# Patient Record
Sex: Female | Born: 1970 | Race: White | Hispanic: No | Marital: Married | Smoking: Current some day smoker
Health system: Southern US, Community
[De-identification: ages and names within clinical notes are randomized; demographics above are authoritative.]

## PROBLEM LIST (undated history)

## (undated) DIAGNOSIS — Z789 Other specified health status: Secondary | ICD-10-CM

## (undated) HISTORY — DX: Other specified health status: Z78.9

## (undated) HISTORY — PX: NO PAST SURGERIES: SHX2092

---

## 2005-02-20 ENCOUNTER — Emergency Department: Payer: Self-pay | Admitting: Emergency Medicine

## 2006-06-03 ENCOUNTER — Ambulatory Visit: Payer: Self-pay | Admitting: Obstetrics and Gynecology

## 2006-06-18 ENCOUNTER — Ambulatory Visit: Payer: Self-pay | Admitting: Obstetrics and Gynecology

## 2006-07-21 ENCOUNTER — Emergency Department: Payer: Self-pay | Admitting: Emergency Medicine

## 2006-12-28 ENCOUNTER — Ambulatory Visit: Payer: Self-pay | Admitting: Obstetrics and Gynecology

## 2008-06-22 ENCOUNTER — Ambulatory Visit: Payer: Self-pay | Admitting: Obstetrics and Gynecology

## 2011-08-13 ENCOUNTER — Ambulatory Visit: Payer: Self-pay | Admitting: Obstetrics and Gynecology

## 2012-08-06 ENCOUNTER — Emergency Department: Payer: Self-pay | Admitting: Emergency Medicine

## 2012-08-17 ENCOUNTER — Ambulatory Visit: Payer: Self-pay | Admitting: Obstetrics and Gynecology

## 2013-08-18 ENCOUNTER — Ambulatory Visit: Payer: Self-pay | Admitting: Obstetrics and Gynecology

## 2014-08-24 ENCOUNTER — Ambulatory Visit: Payer: Self-pay | Admitting: Obstetrics and Gynecology

## 2014-08-25 ENCOUNTER — Ambulatory Visit: Payer: Self-pay | Admitting: Obstetrics and Gynecology

## 2015-06-14 DIAGNOSIS — L237 Allergic contact dermatitis due to plants, except food: Secondary | ICD-10-CM | POA: Insufficient documentation

## 2015-07-24 ENCOUNTER — Other Ambulatory Visit: Payer: Self-pay | Admitting: Obstetrics and Gynecology

## 2015-07-24 DIAGNOSIS — Z1231 Encounter for screening mammogram for malignant neoplasm of breast: Secondary | ICD-10-CM

## 2015-08-27 ENCOUNTER — Ambulatory Visit
Admission: RE | Admit: 2015-08-27 | Discharge: 2015-08-27 | Disposition: A | Discharge status: Home / Self Care | Payer: BLUE CROSS/BLUE SHIELD | Source: Ambulatory Visit | Attending: Obstetrics and Gynecology | Admitting: Obstetrics and Gynecology

## 2015-08-27 DIAGNOSIS — Z1231 Encounter for screening mammogram for malignant neoplasm of breast: Secondary | ICD-10-CM | POA: Insufficient documentation

## 2015-11-12 ENCOUNTER — Emergency Department
Admission: EM | Admit: 2015-11-12 | Discharge: 2015-11-12 | Disposition: A | Discharge status: Home / Self Care | Payer: BLUE CROSS/BLUE SHIELD | Attending: Emergency Medicine | Admitting: Emergency Medicine

## 2015-11-12 ENCOUNTER — Emergency Department: Payer: BLUE CROSS/BLUE SHIELD

## 2015-11-12 ENCOUNTER — Encounter: Payer: Self-pay | Admitting: Medical Oncology

## 2015-11-12 DIAGNOSIS — F17200 Nicotine dependence, unspecified, uncomplicated: Secondary | ICD-10-CM | POA: Diagnosis not present

## 2015-11-12 DIAGNOSIS — Z3202 Encounter for pregnancy test, result negative: Secondary | ICD-10-CM | POA: Diagnosis not present

## 2015-11-12 DIAGNOSIS — N72 Inflammatory disease of cervix uteri: Secondary | ICD-10-CM | POA: Diagnosis not present

## 2015-11-12 DIAGNOSIS — K529 Noninfective gastroenteritis and colitis, unspecified: Secondary | ICD-10-CM | POA: Diagnosis not present

## 2015-11-12 DIAGNOSIS — R1030 Lower abdominal pain, unspecified: Secondary | ICD-10-CM | POA: Diagnosis present

## 2015-11-12 LAB — CBC
HEMATOCRIT: 39.8 % (ref 35.0–47.0)
Hemoglobin: 13.2 g/dL (ref 12.0–16.0)
MCH: 30.1 pg (ref 26.0–34.0)
MCHC: 33.2 g/dL (ref 32.0–36.0)
MCV: 90.5 fL (ref 80.0–100.0)
PLATELETS: 176 10*3/uL (ref 150–440)
RBC: 4.39 MIL/uL (ref 3.80–5.20)
RDW: 13.5 % (ref 11.5–14.5)
WBC: 9.6 10*3/uL (ref 3.6–11.0)

## 2015-11-12 LAB — URINALYSIS COMPLETE WITH MICROSCOPIC (ARMC ONLY)
BACTERIA UA: NONE SEEN
BILIRUBIN URINE: NEGATIVE
GLUCOSE, UA: NEGATIVE mg/dL
Ketones, ur: NEGATIVE mg/dL
LEUKOCYTES UA: NEGATIVE
Nitrite: NEGATIVE
Protein, ur: NEGATIVE mg/dL
Specific Gravity, Urine: 1.002 — ABNORMAL LOW (ref 1.005–1.030)
pH: 6 (ref 5.0–8.0)

## 2015-11-12 LAB — COMPREHENSIVE METABOLIC PANEL
ALBUMIN: 4.2 g/dL (ref 3.5–5.0)
ALT: 32 U/L (ref 14–54)
AST: 28 U/L (ref 15–41)
Alkaline Phosphatase: 64 U/L (ref 38–126)
Anion gap: 6 (ref 5–15)
BILIRUBIN TOTAL: 0.3 mg/dL (ref 0.3–1.2)
BUN: 11 mg/dL (ref 6–20)
CHLORIDE: 108 mmol/L (ref 101–111)
CO2: 24 mmol/L (ref 22–32)
CREATININE: 0.62 mg/dL (ref 0.44–1.00)
Calcium: 9.6 mg/dL (ref 8.9–10.3)
GFR calc Af Amer: >60 mL/min (ref >60)
GLUCOSE: 111 mg/dL — AB (ref 65–99)
POTASSIUM: 4.3 mmol/L (ref 3.5–5.1)
Sodium: 138 mmol/L (ref 135–145)
Total Protein: 7.3 g/dL (ref 6.5–8.1)

## 2015-11-12 LAB — POCT PREGNANCY, URINE: PREG TEST UR: NEGATIVE

## 2015-11-12 LAB — CHLAMYDIA/NGC RT PCR (ARMC ONLY)
CHLAMYDIA TR: NOT DETECTED
N GONORRHOEAE: NOT DETECTED

## 2015-11-12 LAB — WET PREP, GENITAL
CLUE CELLS WET PREP: NONE SEEN
Sperm: NONE SEEN
TRICH WET PREP: NONE SEEN

## 2015-11-12 LAB — LIPASE, BLOOD: LIPASE: 35 U/L (ref 11–51)

## 2015-11-12 MED ORDER — ONDANSETRON HCL 4 MG/2ML IJ SOLN
4.0000 mg | Freq: Once | INTRAMUSCULAR | Status: AC
  Administered 2015-11-12: 4 mg via INTRAVENOUS
  Filled 2015-11-12: qty 2

## 2015-11-12 MED ORDER — FLUCONAZOLE 150 MG PO TABS: 150.0000 mg | ORAL_TABLET | Freq: Once | ORAL | Status: DC

## 2015-11-12 MED ORDER — IOHEXOL 300 MG/ML  SOLN
100.0000 mL | Freq: Once | INTRAMUSCULAR | Status: AC | PRN
  Administered 2015-11-12: 100 mL via INTRAVENOUS

## 2015-11-12 MED ORDER — OXYCODONE-ACETAMINOPHEN 5-325 MG PO TABS: 2.0000 | ORAL_TABLET | Freq: Four times a day (QID) | ORAL | Status: DC | PRN

## 2015-11-12 MED ORDER — IOHEXOL 240 MG/ML SOLN
25.0000 mL | Freq: Once | INTRAMUSCULAR | Status: AC | PRN
  Administered 2015-11-12: 25 mL via ORAL

## 2015-11-12 MED ORDER — MORPHINE SULFATE (PF) 4 MG/ML IV SOLN
4.0000 mg | Freq: Once | INTRAVENOUS | Status: AC
  Administered 2015-11-12: 4 mg via INTRAVENOUS
  Filled 2015-11-12: qty 1

## 2015-11-12 MED ORDER — METRONIDAZOLE 500 MG PO TABS: 500.0000 mg | ORAL_TABLET | Freq: Two times a day (BID) | ORAL | Status: DC

## 2015-11-12 MED ORDER — CIPROFLOXACIN HCL 500 MG PO TABS: 500.0000 mg | ORAL_TABLET | Freq: Two times a day (BID) | ORAL | Status: AC

## 2015-11-12 NOTE — ED Notes (Signed)
Pt reports waking up during the night with lower abd pain. Reports pain is sharp stabbing pain. Reports nausea also.

## 2015-11-12 NOTE — ED Provider Notes (Signed)
[facility] Emergency Department Provider Note     Time seen: ----------------------------------------- 7:45 AM on 11/12/2015 -----------------------------------------    I have reviewed the triage vital signs and the nursing notes.   HISTORY  Chief Complaint Abdominal Pain    HPI Molly [name] is a 44 y.o. female who presents ER for lower abdominal pain. Patient reports pain is sharp stabbing type pain similar to contractions. Patient denies possibility she could be pregnant, also reports some nausea earlier. Pain is 10/10 nothing makes it better, walking or standing makes it worse.   History reviewed. No pertinent past medical history.  There are no active problems to display for this patient.   History reviewed. No pertinent past surgical history.  Allergies Review of patient's allergies indicates no known allergies.  Social History Social History  Substance Use Topics  . Smoking status: Current Every Day Smoker  . Smokeless tobacco: None  . Alcohol Use: Yes     Comment: occ    Review of Systems Constitutional: Negative for fever. Eyes: Negative for visual changes. ENT: Negative for sore throat. Cardiovascular: Negative for chest pain. Respiratory: Negative for shortness of breath. Gastrointestinal: Positive for abdominal pain and nausea Genitourinary: Negative for dysuria. Musculoskeletal: Negative for back pain. Skin: Negative for rash. Neurological: Negative for headaches, focal weakness or numbness.  10-point ROS otherwise negative.  ____________________________________________   PHYSICAL EXAM:  VITAL SIGNS: ED Triage Vitals  Enc Vitals Group     BP 11/12/15 0741 126/81 mmHg     Pulse Rate 11/12/15 0741 76     Resp 11/12/15 0741 18     Temp 11/12/15 0741 98 F (36.7 C)     Temp Source 11/12/15 0741 Oral     SpO2 11/12/15 0741 100 %     Weight 11/12/15 0741 147 lb (66.679 kg)     Height 11/12/15 0741 5\' 5"  (1.651  m)     Head Cir --      Peak Flow --      Pain Score 11/12/15 0741 7     Pain Loc --      Pain Edu? --      Excl. in GC? --     Constitutional: Alert and oriented. Well appearing and in no distress. Eyes: Conjunctivae are normal. PERRL. Normal extraocular movements. ENT   Head: Normocephalic and atraumatic.   Nose: No congestion/rhinnorhea.   Mouth/Throat: Mucous membranes are moist.   Neck: No stridor. Cardiovascular: Normal rate, regular rhythm. Normal and symmetric distal pulses are present in all extremities. No murmurs, rubs, or gallops. Respiratory: Normal respiratory effort without tachypnea nor retractions. Breath sounds are clear and equal bilaterally. No wheezes/rales/rhonchi. Gastrointestinal: Lower abdominal tenderness, no rebound or guarding. Normal bowel sounds. Genitourinary: Mild cervicitis with white vaginal discharge. No adnexal tenderness. Musculoskeletal: Nontender with normal range of motion in all extremities. No joint effusions.  No lower extremity tenderness nor edema. Neurologic:  Normal speech and language. No gross focal neurologic deficits are appreciated. Speech is normal. No gait instability. Skin:  Skin is warm, dry and intact. No rash noted. Psychiatric: Mood and affect are normal. Speech and behavior are normal. Patient exhibits appropriate insight and judgment. ____________________________________________  ED COURSE:  Pertinent labs & imaging results that were available during my care of the patient were reviewed by me and considered in my medical decision making (see chart for details). Patients in no acute distress, will check basic labs and reevaluate. ____________________________________________    LABS (pertinent positives/negatives)  Labs Reviewed  WET PREP, GENITAL - Abnormal; Notable for the following:    Yeast Wet Prep HPF POC FEW (*)    WBC, Wet Prep HPF POC FEW (*)    All other components within normal limits   COMPREHENSIVE METABOLIC PANEL - Abnormal; Notable for the following:    Glucose, Bld 111 (*)    All other components within normal limits  URINALYSIS COMPLETEWITH MICROSCOPIC ([facility] ONLY) - Abnormal; Notable for the following:    Color, Urine COLORLESS (*)    APPearance CLEAR (*)    Specific Gravity, Urine 1.002 (*)    Hgb urine dipstick 1+ (*)    Squamous Epithelial / LPF 0-5 (*)    All other components within normal limits  CHLAMYDIA/NGC RT PCR ([facility] ONLY)  LIPASE, BLOOD  CBC  POC URINE PREG, ED  POCT PREGNANCY, URINE    RADIOLOGY Images were viewed by me  IMPRESSION: 1. Proximal descending colitis, likely infectious or inflammatory in etiology. 2. Hepatomegaly. Liver also appears slightly fatty. 3. Tiny left renal stone, no obstruction.  ____________________________________________  FINAL ASSESSMENT AND PLAN  Abdominal pain, colitis  Plan: Patient with labs and imaging as dictated above. Patient with a surprising finding of colitis on CT given her symptoms. Patient be discharged with Cipro and Flagyl, given Percocet for pain. She is advised to follow-up in 2 days for recheck.   Emily Filbert, MD   Emily Filbert, MD 11/12/15 [phone]

## 2015-11-12 NOTE — Discharge Instructions (Signed)
°  Colitis °Colitis is inflammation of the colon. Colitis may last a short time (acute) or it may last a long time (chronic). °CAUSES °This condition may be caused by: °· Viruses. °· Bacteria. °· Reactions to medicine. °· Certain autoimmune diseases, such as Crohn disease or ulcerative colitis. °SYMPTOMS °Symptoms of this condition include: °· Diarrhea. °· Passing bloody or tarry stool. °· Pain. °· Fever. °· Vomiting. °· Tiredness (fatigue). °· Weight loss. °· Bloating. °· Sudden increase in abdominal pain. °· Having fewer bowel movements than usual. °DIAGNOSIS °This condition is diagnosed with a stool test or a blood test. You may also have other tests, including X-rays, a CT scan, or a colonoscopy. °TREATMENT °Treatment may include: °· Resting the bowel. This involves not eating or drinking for a period of time. °· Fluids that are given through an IV tube. °· Medicine for pain and diarrhea. °· Antibiotic medicines. °· Cortisone medicines. °· Surgery. °HOME CARE INSTRUCTIONS °Eating and Drinking °· Follow instructions from your health care provider about eating or drinking restrictions. °· Drink enough fluid to keep your urine clear or pale yellow. °· Work with a dietitian to determine which foods cause your condition to flare up. °· Avoid foods that cause flare-ups. °· Eat a well-balanced diet. °Medicines °· Take over-the-counter and prescription medicines only as told by your health care provider. °· If you were prescribed an antibiotic medicine, take it as told by your health care provider. Do not stop taking the antibiotic even if you start to feel better. °General Instructions °· Keep all follow-up visits as told by your health care provider. This is important. °SEEK MEDICAL CARE IF: °· Your symptoms do not go away. °· You develop new symptoms. °SEEK IMMEDIATE MEDICAL CARE IF: °· You have a fever that does not go away with treatment. °· You develop chills. °· You have extreme weakness, fainting, or  dehydration. °· You have repeated vomiting. °· You develop severe pain in your abdomen. °· You pass bloody or tarry stool. °  °This information is not intended to replace advice given to you by your health care provider. Make sure you discuss any questions you have with your health care provider. °  °Document Released: 01/08/2005 Document Revised: 08/22/2015 Document Reviewed: 03/26/2015 °Elsevier Interactive Patient Education ©2016 [employer] ° °

## 2015-12-10 ENCOUNTER — Emergency Department
Admission: EM | Admit: 2015-12-10 | Discharge: 2015-12-10 | Discharge status: Against Medical Advice | Payer: BLUE CROSS/BLUE SHIELD | Attending: Emergency Medicine | Admitting: Emergency Medicine

## 2015-12-10 DIAGNOSIS — F17200 Nicotine dependence, unspecified, uncomplicated: Secondary | ICD-10-CM | POA: Insufficient documentation

## 2015-12-10 DIAGNOSIS — Y9289 Other specified places as the place of occurrence of the external cause: Secondary | ICD-10-CM | POA: Insufficient documentation

## 2015-12-10 DIAGNOSIS — Y998 Other external cause status: Secondary | ICD-10-CM | POA: Insufficient documentation

## 2015-12-10 DIAGNOSIS — W228XXA Striking against or struck by other objects, initial encounter: Secondary | ICD-10-CM | POA: Diagnosis not present

## 2015-12-10 DIAGNOSIS — S0181XA Laceration without foreign body of other part of head, initial encounter: Secondary | ICD-10-CM | POA: Diagnosis not present

## 2015-12-10 DIAGNOSIS — Y9389 Activity, other specified: Secondary | ICD-10-CM | POA: Diagnosis not present

## 2015-12-10 NOTE — ED Notes (Addendum)
Pt states she became upset and hit herself in the head with a mason jar.. Pt has a puncture wound to the right side of the head with controlled bleeding.

## 2015-12-12 ENCOUNTER — Telehealth: Payer: Self-pay | Admitting: Emergency Medicine

## 2015-12-12 NOTE — ED Notes (Signed)
Called patient due to lwot to inquire about condition and follow up plans. Left message with my number. 

## 2017-09-18 ENCOUNTER — Other Ambulatory Visit: Payer: Self-pay | Admitting: Obstetrics and Gynecology

## 2017-09-18 DIAGNOSIS — N6489 Other specified disorders of breast: Secondary | ICD-10-CM

## 2017-10-01 ENCOUNTER — Ambulatory Visit
Admission: RE | Admit: 2017-10-01 | Discharge: 2017-10-01 | Disposition: A | Discharge status: Home / Self Care | Payer: 59 | Source: Ambulatory Visit | Attending: Obstetrics and Gynecology | Admitting: Obstetrics and Gynecology

## 2017-10-01 ENCOUNTER — Ambulatory Visit: Payer: BLUE CROSS/BLUE SHIELD

## 2017-10-01 DIAGNOSIS — N6489 Other specified disorders of breast: Secondary | ICD-10-CM

## 2018-06-29 ENCOUNTER — Other Ambulatory Visit: Payer: Self-pay | Admitting: Obstetrics & Gynecology

## 2018-06-29 DIAGNOSIS — Z1231 Encounter for screening mammogram for malignant neoplasm of breast: Secondary | ICD-10-CM

## 2018-08-18 DIAGNOSIS — N841 Polyp of cervix uteri: Secondary | ICD-10-CM | POA: Diagnosis not present

## 2018-08-18 DIAGNOSIS — Z01419 Encounter for gynecological examination (general) (routine) without abnormal findings: Secondary | ICD-10-CM | POA: Diagnosis not present

## 2018-08-18 DIAGNOSIS — R5383 Other fatigue: Secondary | ICD-10-CM | POA: Diagnosis not present

## 2018-09-08 ENCOUNTER — Ambulatory Visit
Admission: RE | Admit: 2018-09-08 | Discharge: 2018-09-08 | Disposition: A | Discharge status: Home / Self Care | Payer: 59 | Source: Ambulatory Visit | Attending: Obstetrics & Gynecology | Admitting: Obstetrics & Gynecology

## 2018-09-08 DIAGNOSIS — Z1231 Encounter for screening mammogram for malignant neoplasm of breast: Secondary | ICD-10-CM

## 2018-09-29 DIAGNOSIS — J019 Acute sinusitis, unspecified: Secondary | ICD-10-CM | POA: Diagnosis not present

## 2019-05-11 ENCOUNTER — Telehealth: Payer: Self-pay | Admitting: Obstetrics & Gynecology

## 2019-05-11 NOTE — Telephone Encounter (Signed)
Left message for pt to call office back to make appt. We have received her medical records from [facility].   Thanks

## 2019-07-29 ENCOUNTER — Ambulatory Visit: Payer: Self-pay | Admitting: Obstetrics and Gynecology

## 2019-08-03 ENCOUNTER — Encounter: Payer: Self-pay | Admitting: Obstetrics and Gynecology

## 2019-08-03 ENCOUNTER — Other Ambulatory Visit: Payer: Self-pay

## 2019-08-03 ENCOUNTER — Other Ambulatory Visit (HOSPITAL_COMMUNITY)
Admission: RE | Admit: 2019-08-03 | Discharge: 2019-08-03 | Disposition: A | Discharge status: Home / Self Care | Payer: BC Managed Care – PPO | Source: Ambulatory Visit | Attending: Obstetrics and Gynecology | Admitting: Obstetrics and Gynecology

## 2019-08-03 ENCOUNTER — Ambulatory Visit (INDEPENDENT_AMBULATORY_CARE_PROVIDER_SITE_OTHER): Payer: BC Managed Care – PPO | Admitting: Obstetrics and Gynecology

## 2019-08-03 VITALS — BP 100/62 | HR 75 | Ht 65.0 in | Wt 140.0 lb

## 2019-08-03 DIAGNOSIS — Z124 Encounter for screening for malignant neoplasm of cervix: Secondary | ICD-10-CM | POA: Insufficient documentation

## 2019-08-03 DIAGNOSIS — Z01419 Encounter for gynecological examination (general) (routine) without abnormal findings: Secondary | ICD-10-CM | POA: Diagnosis not present

## 2019-08-03 DIAGNOSIS — Z0000 Encounter for general adult medical examination without abnormal findings: Secondary | ICD-10-CM

## 2019-08-03 DIAGNOSIS — Z1329 Encounter for screening for other suspected endocrine disorder: Secondary | ICD-10-CM

## 2019-08-03 DIAGNOSIS — Z130 Encounter for screening for diseases of the blood and blood-forming organs and certain disorders involving the immune mechanism: Secondary | ICD-10-CM

## 2019-08-03 DIAGNOSIS — Z1239 Encounter for other screening for malignant neoplasm of breast: Secondary | ICD-10-CM

## 2019-08-03 DIAGNOSIS — Z30011 Encounter for initial prescription of contraceptive pills: Secondary | ICD-10-CM

## 2019-08-03 DIAGNOSIS — Z131 Encounter for screening for diabetes mellitus: Secondary | ICD-10-CM

## 2019-08-03 DIAGNOSIS — Z13220 Encounter for screening for lipoid disorders: Secondary | ICD-10-CM

## 2019-08-03 MED ORDER — CRYSELLE-28 0.3-30 MG-MCG PO TABS: 1.0000 | ORAL_TABLET | Freq: Every day | ORAL | 3 refills | Status: DC

## 2019-08-03 NOTE — Progress Notes (Signed)
Gynecology Annual Exam  PCP: Schermerhorn, Ihor Austinhomas J, MD  Chief Complaint:  Chief Complaint  Patient presents with  . Gynecologic Exam    Anxiety and depression, father rcently dx with cancer    History of Present Illness: Patient is a 48 y.o. [id number] presents for annual exam. The patient has no complaints today.   LMP: Patient's last menstrual period was 06/27/2019 (approximate). Average Interval: regular, 28 days Duration of flow: 5 days Heavy Menses: no Clots: no Intermenstrual Bleeding: no Postcoital Bleeding: no Dysmenorrhea: no   The patient is sexually active. She currently uses OCP (estrogen/progesterone) for contraception. She denies dyspareunia.  The patient does perform self breast exams.  There is no notable family history of breast or ovarian cancer in her family.  The patient wears seatbelts: yes.   The patient has regular exercise: no.    The patient denies current symptoms of depression.    Review of Systems: Review of Systems  Constitutional: Negative for chills, fever, malaise/fatigue and weight loss.  HENT: Negative for congestion, hearing loss and sinus pain.   Eyes: Negative for blurred vision and double vision.  Respiratory: Negative for cough, sputum production, shortness of breath and wheezing.   Cardiovascular: Negative for chest pain, palpitations, orthopnea and leg swelling.  Gastrointestinal: Negative for abdominal pain, constipation, diarrhea, nausea and vomiting.  Genitourinary: Negative for dysuria, flank pain, frequency, hematuria and urgency.  Musculoskeletal: Negative for back pain, falls and joint pain.  Skin: Negative for itching and rash.  Neurological: Negative for dizziness and headaches.  Psychiatric/Behavioral: Negative for depression, substance abuse and suicidal ideas. The patient is not nervous/anxious.     Past Medical History:  History reviewed. No pertinent past medical history.  Past Surgical History:  History  reviewed. No pertinent surgical history.  Gynecologic History:  Patient's last menstrual period was 06/27/2019 (approximate). Contraception: OCP (estrogen/progesterone) Last Pap: Results were:  unknown  Last mammogram: 09/08/2018 Results were: BI-RAD I  Obstetric History: [id number]: G2P2002  Family History:  Family History  Problem Relation Age of Onset  . Breast cancer Paternal Aunt     Social History:  Social History   Socioeconomic History  . Marital status: Married    Spouse name: Not on file  . Number of children: Not on file  . Years of education: Not on file  . Highest education level: Not on file  Occupational History  . Not on file  Social Needs  . Financial resource strain: Not on file  . Food insecurity    Worry: Not on file    Inability: Not on file  . Transportation needs    Medical: Not on file    Non-medical: Not on file  Tobacco Use  . Smoking status: Current Every Day Smoker  . Smokeless tobacco: Never Used  Substance and Sexual Activity  . Alcohol use: Yes    Comment: occ  . Drug use: Never  . Sexual activity: Yes    Birth control/protection: Pill  Lifestyle  . Physical activity    Days per week: Not on file    Minutes per session: Not on file  . Stress: Not on file  Relationships  . Social Musicianconnections    Talks on phone: Not on file    Gets together: Not on file    Attends religious service: Not on file    Active member of club or organization: Not on file    Attends meetings of clubs or organizations: Not on file    Relationship  status: Not on file  . Intimate partner violence    Fear of current or ex partner: Not on file    Emotionally abused: Not on file    Physically abused: Not on file    Forced sexual activity: Not on file  Other Topics Concern  . Not on file  Social History Narrative  . Not on file    Allergies:  No Known Allergies  Medications: Prior to Admission medications   Medication Sig Start Date End Date Taking? Authorizing  Provider  [id number] 0.3-30 MG-MCG tablet Take 1 tablet by mouth daily. 11/02/15  Yes [provider]  albuterol (VENTOLIN HFA) 108 (90 Base) MCG/ACT inhaler Inhale into the lungs. 07/23/16   [provider]    Physical Exam Vitals: Blood pressure 100/62, pulse 75, height 5\' 5"  (1.651 m), weight 140 lb (63.5 kg), last menstrual period 06/27/2019.  General: NAD HEENT: normocephalic, anicteric Thyroid: no enlargement, no palpable nodules Pulmonary: No increased work of breathing, CTAB Cardiovascular: RRR, distal pulses 2+ Breast: Breast symmetrical, no tenderness, no palpable nodules or masses, no skin or nipple retraction present, no nipple discharge.  No axillary or supraclavicular lymphadenopathy. Abdomen: NABS, soft, non-tender, non-distended.  Umbilicus without lesions.  No hepatomegaly, splenomegaly or masses palpable. No evidence of hernia  Genitourinary:  External: Normal external female genitalia.  Normal urethral meatus, normal Bartholin's and Skene's glands.    Vagina: Normal vaginal mucosa, no evidence of prolapse.    Cervix: Grossly normal in appearance, no bleeding  Uterus: Non-enlarged, mobile, normal contour.  No CMT  Adnexa: ovaries non-enlarged, no adnexal masses  Rectal: deferred  Lymphatic: no evidence of inguinal lymphadenopathy Extremities: no edema, erythema, or tenderness Neurologic: Grossly intact Psychiatric: mood appropriate, affect full  Female chaperone present for pelvic and breast  portions of the physical exam    Assessment: 48 y.o. [id number] routine annual exam  Plan: Problem List Items Addressed This Visit    None    Visit Diagnoses    Healthcare maintenance    -  Primary   Screening for cervical cancer       Screening for breast cancer          1) Mammogram - recommend yearly screening mammogram.  Mammogram Was ordered today   2) STI screening  was offered and declined  3) ASCCP guidelines and rational discussed.   Patient opts for every 5 years screening interval  4) Contraception - the patient is currently using  IUD.  She is happy with her current form of contraception and plans to continue  Counseled against continuation of OCP pill because of her age and smoking status. Discussed the risks of heart attack and stroke which are significantly elevated for her. She did not wish to switch contraceptive methods despite my cautioning against their continued usaged because of risk of heart attack and stroke. Reviewed that this was a category 3 recommendation based on [facility] criteria or ver the age of 42 and smoking 2 cigarettes a day.  Declined alternative measures such as POP or IUD or Depo Provera. "Maybe next year" was her response to counseling.    5) Colonoscopy -- Screening recommended starting at age 76 for average risk individuals, age 32 for individuals deemed at increased risk (including African Americans) and recommended to continue until age 53.  For patient age 61-85 individualized approach is recommended.  Gold standard screening is via colonoscopy, Cologuard screening is an acceptable alternative for patient unwilling or unable to undergo colonoscopy.  "Colorectal  cancer screening for average?risk adults: 2018 guideline update from the American Cancer Society"CA: A Cancer Journal for Clinicians: May 13, 2017   6) Routine healthcare maintenance including cholesterol, diabetes screening discussed To return fasting at a later date  7) No follow-ups on file.  Adelene Idlerhristanna Benson Porcaro MD [facility] 08/03/2019 2:52 PM

## 2019-08-08 LAB — CYTOLOGY - PAP
Diagnosis: NEGATIVE
HPV: NOT DETECTED

## 2019-08-08 NOTE — Progress Notes (Signed)
Pt aware.

## 2019-08-08 NOTE — Progress Notes (Signed)
Please call patient with normal pap smear result. Thank you

## 2019-08-12 ENCOUNTER — Other Ambulatory Visit: Payer: BC Managed Care – PPO

## 2019-08-12 ENCOUNTER — Other Ambulatory Visit: Payer: Self-pay

## 2019-08-12 DIAGNOSIS — Z131 Encounter for screening for diabetes mellitus: Secondary | ICD-10-CM | POA: Diagnosis not present

## 2019-08-12 DIAGNOSIS — Z1329 Encounter for screening for other suspected endocrine disorder: Secondary | ICD-10-CM

## 2019-08-12 DIAGNOSIS — Z0000 Encounter for general adult medical examination without abnormal findings: Secondary | ICD-10-CM

## 2019-08-12 DIAGNOSIS — Z130 Encounter for screening for diseases of the blood and blood-forming organs and certain disorders involving the immune mechanism: Secondary | ICD-10-CM | POA: Diagnosis not present

## 2019-08-12 DIAGNOSIS — Z13220 Encounter for screening for lipoid disorders: Secondary | ICD-10-CM

## 2019-08-13 LAB — COMPREHENSIVE METABOLIC PANEL
ALT: 22 IU/L (ref 0–32)
AST: 19 IU/L (ref 0–40)
Albumin/Globulin Ratio: 2 (ref 1.2–2.2)
Albumin: 4.7 g/dL (ref 3.8–4.8)
Alkaline Phosphatase: 52 IU/L (ref 39–117)
BUN/Creatinine Ratio: 15 (ref 9–23)
BUN: 11 mg/dL (ref 6–24)
Bilirubin Total: 0.4 mg/dL (ref 0.0–1.2)
CO2: 21 mmol/L (ref 20–29)
Calcium: 9.2 mg/dL (ref 8.7–10.2)
Chloride: 105 mmol/L (ref 96–106)
Creatinine, Ser: 0.75 mg/dL (ref 0.57–1.00)
GFR calc Af Amer: 109 mL/min/{1.73_m2} (ref >59)
GFR calc non Af Amer: 95 mL/min/{1.73_m2} (ref >59)
Globulin, Total: 2.3 g/dL (ref 1.5–4.5)
Glucose: 100 mg/dL — ABNORMAL HIGH (ref 65–99)
Potassium: 4.2 mmol/L (ref 3.5–5.2)
Sodium: 142 mmol/L (ref 134–144)
Total Protein: 7 g/dL (ref 6.0–8.5)

## 2019-08-13 LAB — LIPID PANEL
Chol/HDL Ratio: 4.5 ratio — ABNORMAL HIGH (ref 0.0–4.4)
Cholesterol, Total: 177 mg/dL (ref 100–199)
HDL: 39 mg/dL — ABNORMAL LOW (ref >39)
LDL Calculated: 121 mg/dL — ABNORMAL HIGH (ref 0–99)
Triglycerides: 85 mg/dL (ref 0–149)
VLDL Cholesterol Cal: 17 mg/dL (ref 5–40)

## 2019-08-13 LAB — TSH+FREE T4
Free T4: 1.39 ng/dL (ref 0.82–1.77)
TSH: 0.779 u[IU]/mL (ref 0.450–4.500)

## 2019-08-13 LAB — CBC
Hematocrit: 38.8 % (ref 34.0–46.6)
Hemoglobin: 13.1 g/dL (ref 11.1–15.9)
MCH: 31.4 pg (ref 26.6–33.0)
MCHC: 33.8 g/dL (ref 31.5–35.7)
MCV: 93 fL (ref 79–97)
Platelets: 177 10*3/uL (ref 150–450)
RBC: 4.17 x10E6/uL (ref 3.77–5.28)
RDW: 12.3 % (ref 11.7–15.4)
WBC: 6.3 10*3/uL (ref 3.4–10.8)

## 2019-08-26 ENCOUNTER — Other Ambulatory Visit: Payer: Self-pay

## 2019-08-26 ENCOUNTER — Telehealth: Payer: Self-pay | Admitting: Obstetrics and Gynecology

## 2019-08-26 DIAGNOSIS — Z30011 Encounter for initial prescription of contraceptive pills: Secondary | ICD-10-CM

## 2019-08-26 MED ORDER — CRYSELLE-28 0.3-30 MG-MCG PO TABS: 1.0000 | ORAL_TABLET | Freq: Every day | ORAL | 11 refills | Status: DC

## 2019-08-26 NOTE — Telephone Encounter (Signed)
Called pt and LVM for her to call the pharmacy and see if they put the medication back because she didn't pick it up in the 7-10 day window. There is a conformation that the pharmacy received the medication request on 08/03/19.

## 2019-08-26 NOTE — Telephone Encounter (Signed)
Patient calling stating Rx was not ever sent to pharmacy for [facility], she was seen by CS on 8/19.  [place].

## 2019-09-21 ENCOUNTER — Other Ambulatory Visit: Payer: Self-pay

## 2019-09-21 ENCOUNTER — Ambulatory Visit
Admission: RE | Admit: 2019-09-21 | Discharge: 2019-09-21 | Disposition: A | Discharge status: Home / Self Care | Payer: 59 | Source: Ambulatory Visit | Attending: Obstetrics and Gynecology | Admitting: Obstetrics and Gynecology

## 2019-09-21 DIAGNOSIS — Z1231 Encounter for screening mammogram for malignant neoplasm of breast: Secondary | ICD-10-CM | POA: Diagnosis not present

## 2019-09-21 DIAGNOSIS — Z1239 Encounter for other screening for malignant neoplasm of breast: Secondary | ICD-10-CM

## 2019-09-21 NOTE — Progress Notes (Signed)
Please call patient with normal result.

## 2019-09-21 NOTE — Progress Notes (Signed)
Pt aware.

## 2019-10-21 ENCOUNTER — Other Ambulatory Visit: Payer: Self-pay

## 2019-10-21 DIAGNOSIS — Z20822 Contact with and (suspected) exposure to covid-19: Secondary | ICD-10-CM

## 2019-10-22 LAB — NOVEL CORONAVIRUS, NAA: SARS-CoV-2, NAA: NOT DETECTED

## 2020-01-03 DIAGNOSIS — R300 Dysuria: Secondary | ICD-10-CM | POA: Diagnosis not present

## 2020-07-10 ENCOUNTER — Other Ambulatory Visit: Payer: Self-pay | Admitting: Obstetrics and Gynecology

## 2020-07-10 DIAGNOSIS — Z1231 Encounter for screening mammogram for malignant neoplasm of breast: Secondary | ICD-10-CM

## 2020-08-07 ENCOUNTER — Encounter: Payer: Self-pay | Admitting: Obstetrics and Gynecology

## 2020-08-07 ENCOUNTER — Other Ambulatory Visit: Payer: Self-pay

## 2020-08-07 ENCOUNTER — Ambulatory Visit (INDEPENDENT_AMBULATORY_CARE_PROVIDER_SITE_OTHER): Payer: BC Managed Care – PPO | Admitting: Obstetrics and Gynecology

## 2020-08-07 VITALS — BP 122/74 | Ht 65.0 in | Wt 148.6 lb

## 2020-08-07 DIAGNOSIS — Z01419 Encounter for gynecological examination (general) (routine) without abnormal findings: Secondary | ICD-10-CM | POA: Diagnosis not present

## 2020-08-07 DIAGNOSIS — Z131 Encounter for screening for diabetes mellitus: Secondary | ICD-10-CM

## 2020-08-07 DIAGNOSIS — Z122 Encounter for screening for malignant neoplasm of respiratory organs: Secondary | ICD-10-CM | POA: Diagnosis not present

## 2020-08-07 DIAGNOSIS — Z1231 Encounter for screening mammogram for malignant neoplasm of breast: Secondary | ICD-10-CM | POA: Diagnosis not present

## 2020-08-07 DIAGNOSIS — Z130 Encounter for screening for diseases of the blood and blood-forming organs and certain disorders involving the immune mechanism: Secondary | ICD-10-CM

## 2020-08-07 DIAGNOSIS — Z87891 Personal history of nicotine dependence: Secondary | ICD-10-CM

## 2020-08-07 DIAGNOSIS — Z0000 Encounter for general adult medical examination without abnormal findings: Secondary | ICD-10-CM

## 2020-08-07 DIAGNOSIS — Z30011 Encounter for initial prescription of contraceptive pills: Secondary | ICD-10-CM

## 2020-08-07 DIAGNOSIS — Z1329 Encounter for screening for other suspected endocrine disorder: Secondary | ICD-10-CM

## 2020-08-07 DIAGNOSIS — Z13220 Encounter for screening for lipoid disorders: Secondary | ICD-10-CM

## 2020-08-07 MED ORDER — CRYSELLE-28 0.3-30 MG-MCG PO TABS: 1.0000 | ORAL_TABLET | Freq: Every day | ORAL | 11 refills | Status: DC

## 2020-08-07 NOTE — Patient Instructions (Signed)
Institute of Medicine Recommended Dietary Allowances for Calcium and Vitamin D  Age (yr) Calcium Recommended Dietary Allowance (mg/day) Vitamin D Recommended Dietary Allowance (international units/day)  9-18 1,300 600  19-50 1,000 600  51-70 1,200 600  71 and older 1,200 800  Data from Institute of Medicine. Dietary reference intakes: calcium, vitamin D. [place], [place]: National Academies Press; 2011.     Exercising to Stay Healthy To become healthy and stay healthy, it is recommended that you do moderate-intensity and vigorous-intensity exercise. You can tell that you are exercising at a moderate intensity if your heart starts beating faster and you start breathing faster but can still hold a conversation. You can tell that you are exercising at a vigorous intensity if you are breathing much harder and faster and cannot hold a conversation while exercising. Exercising regularly is important. It has many health benefits, such as:  Improving overall fitness, flexibility, and endurance.  Increasing bone density.  Helping with weight control.  Decreasing body fat.  Increasing muscle strength.  Reducing stress and tension.  Improving overall health. How often should I exercise? Choose an activity that you enjoy, and set realistic goals. Your health care provider can help you make an activity plan that works for you. Exercise regularly as told by your health care provider. This may include:  Doing strength training two times a week, such as: ? Lifting weights. ? Using resistance bands. ? Push-ups. ? Sit-ups. ? Yoga.  Doing a certain intensity of exercise for a given amount of time. Choose from these options: ? A total of 150 minutes of moderate-intensity exercise every week. ? A total of 75 minutes of vigorous-intensity exercise every week. ? A mix of moderate-intensity and vigorous-intensity exercise every week. Children, pregnant women, people who have not exercised  regularly, people who are overweight, and older adults may need to talk with a health care provider about what activities are safe to do. If you have a medical condition, be sure to talk with your health care provider before you start a new exercise program. What are some exercise ideas? Moderate-intensity exercise ideas include:  Walking 1 mile (1.6 km) in about 15 minutes.  Biking.  Hiking.  Golfing.  Dancing.  Water aerobics. Vigorous-intensity exercise ideas include:  Walking 4.5 miles (7.2 km) or more in about 1 hour.  Jogging or running 5 miles (8 km) in about 1 hour.  Biking 10 miles (16.1 km) or more in about 1 hour.  Lap swimming.  Roller-skating or in-line skating.  Cross-country skiing.  Vigorous competitive sports, such as football, basketball, and soccer.  Jumping rope.  Aerobic dancing. What are some everyday activities that can help me to get exercise?  Yard work, such as: ? Pushing a lawn mower. ? Raking and bagging leaves.  Washing your car.  Pushing a stroller.  Shoveling snow.  Gardening.  Washing windows or floors. How can I be more active in my day-to-day activities?  Use stairs instead of an elevator.  Take a walk during your lunch break.  If you drive, park your car farther away from your work or school.  If you take public transportation, get off one stop early and walk the rest of the way.  Stand up or walk around during all of your indoor phone calls.  Get up, stretch, and walk around every 30 minutes throughout the day.  Enjoy exercise with a friend. Support to continue exercising will help you keep a regular routine of activity. What guidelines can   I follow while exercising?  Before you start a new exercise program, talk with your health care provider.  Do not exercise so much that you hurt yourself, feel dizzy, or get very short of breath.  Wear comfortable clothes and wear shoes with good support.  Drink plenty of  water while you exercise to prevent dehydration or heat stroke.  Work out until your breathing and your heartbeat get faster. Where to find more information  U.S[place] Department of Health and Human Services: www.hhs.gov  Centers for Disease Control and Prevention (CDC): www.cdc.gov Summary  Exercising regularly is important. It will improve your overall fitness, flexibility, and endurance.  Regular exercise also will improve your overall health. It can help you control your weight, reduce stress, and improve your bone density.  Do not exercise so much that you hurt yourself, feel dizzy, or get very short of breath.  Before you start a new exercise program, talk with your health care provider. This information is not intended to replace advice given to you by your health care provider. Make sure you discuss any questions you have with your health care provider. Document Revised: 11/13/2017 Document Reviewed: 10/22/2017 Elsevier Patient Education  2020 Elsevier Inc.   Budget-Friendly Healthy Eating There are many ways to save money at the grocery store and continue to eat healthy. You can be successful if you:  Plan meals according to your budget.  Make a grocery list and only purchase food according to your grocery list.  Prepare food yourself. What are tips for following this plan?  Reading food labels  Compare food labels between brand name foods and the store brand. Often the nutritional value is the same, but the store brand is lower cost.  Look for products that do not have added sugar, fat, or salt (sodium). These often cost the same but are healthier for you. Products may be labeled as: ? Sugar-free. ? Nonfat. ? Low-fat. ? Sodium-free. ? Low-sodium.  Look for lean ground beef labeled as at least 92% lean and 8% fat. Shopping  Buy only the items on your grocery list and go only to the areas of the store that have the items on your list.  Use coupons only for foods  and brands you normally buy. Avoid buying items you wouldn't normally buy simply because they are on sale.  Check online and in newspapers for weekly deals.  Buy healthy items from the bulk bins when available, such as herbs, spices, flour, pasta, nuts, and dried fruit.  Buy fruits and vegetables that are in season. Prices are usually lower on in-season produce.  Look at the unit price on the price tag. Use it to compare different brands and sizes to find out which item is the best deal.  Choose healthy items that are often low-cost, such as carrots, potatoes, apples, bananas, and oranges. Dried or canned beans are a low-cost protein source.  Buy in bulk and freeze extra food. Items you can buy in bulk include meats, fish, poultry, frozen fruits, and frozen vegetables.  Avoid buying "ready-to-eat" foods, such as pre-cut fruits and vegetables and pre-made salads.  If possible, shop around to discover where you can find the best prices. Consider other retailers such as dollar stores, larger wholesale stores, local fruit and vegetable stands, and farmers markets.  Do not shop when you are hungry. If you shop while hungry, it may be hard to stick to your list and budget.  Resist impulse buying. Use your grocery list as   your official plan for the week.  Buy a variety of vegetables and fruits by purchasing fresh, frozen, and canned items.  Look at the top and bottom shelves for deals. Foods at eye level (eye level of an adult or child) are usually more expensive.  Be efficient with your time when shopping. The more time you spend at the store, the more money you are likely to spend.  To save money when choosing more expensive foods like meats and dairy: ? Choose cheaper cuts of meat, such as bone-in chicken thighs and drumsticks instead of skinless and boneless chicken. When you are ready to prepare the chicken, you can remove the skin yourself to make it healthier. ? Choose lean meats like  chicken or turkey instead of beef. ? Choose canned seafood, such as tuna, salmon, or sardines. ? Buy eggs as a low-cost source of protein. ? Buy dried beans and peas, such as lentils, split peas, or kidney beans instead of meats. Dried beans and peas are a good alternative source of protein. ? Buy the larger tubs of yogurt instead of individual-sized containers.  Choose water instead of sodas and other sweetened beverages.  Avoid buying chips, cookies, and other "junk food." These items are usually expensive and not healthy. Cooking  Make extra food and freeze the extras in meal-sized containers or in individual portions for fast meals and snacks.  Pre-cook on days when you have extra time to prepare meals in advance. You can keep these meals in the fridge or freezer and reheat for a quick meal.  When you come home from the grocery store, wash, peel, and cut fruits and vegetables so they are ready to use and eat. This will help reduce food waste. Meal planning  Do not eat out or get fast food. Prepare food at home.  Make a grocery list and make sure to bring it with you to the store. If you have a smart phone, you could use your phone to create your shopping list.  Plan meals and snacks according to a grocery list and budget you create.  Use leftovers in your meal plan for the week.  Look for recipes where you can cook once and make enough food for two meals.  Include budget-friendly meals like stews, casseroles, and stir-fry dishes.  Try some meatless meals or try "no cook" meals like salads.  Make sure that half your plate is filled with fruits or vegetables. Choose from fresh, frozen, or canned fruits and vegetables. If eating canned, remember to rinse them before eating. This will remove any excess salt added for packaging. Summary  Eating healthy on a budget is possible if you plan your meals according to your budget, purchase according to your budget and grocery list, and  prepare food yourself.  Tips for buying more food on a limited budget include buying generic brands, using coupons only for foods you normally buy, and buying healthy items from the bulk bins when available.  Tips for buying cheaper food to replace expensive food include choosing cheaper, lean cuts of meat, and buying dried beans and peas. This information is not intended to replace advice given to you by your health care provider. Make sure you discuss any questions you have with your health care provider. Document Revised: 12/02/2017 Document Reviewed: 12/02/2017 Elsevier Patient Education  2020 Elsevier Inc.   Bone Health Bones protect organs, store calcium, anchor muscles, and support the whole body. Keeping your bones strong is important, especially as you   get older. You can take actions to help keep your bones strong and healthy. Why is keeping my bones healthy important?  Keeping your bones healthy is important because your body constantly replaces bone cells. Cells get old, and new cells take their place. As we age, we lose bone cells because the body may not be able to make enough new cells to replace the old cells. The amount of bone cells and bone tissue you have is referred to as bone mass. The higher your bone mass, the stronger your bones. The aging process leads to an overall loss of bone mass in the body, which can increase the likelihood of:  Joint pain and stiffness.  Broken bones.  A condition in which the bones become weak and brittle (osteoporosis). A large decline in bone mass occurs in older adults. In women, it occurs about the time of menopause. What actions can I take to keep my bones healthy? Good health habits are important for maintaining healthy bones. This includes eating nutritious foods and exercising regularly. To have healthy bones, you need to get enough of the right minerals and vitamins. Most nutrition experts recommend getting these nutrients from the  foods that you eat. In some cases, taking supplements may also be recommended. Doing certain types of exercise is also important for bone health. What are the nutritional recommendations for healthy bones?  Eating a well-balanced diet with plenty of calcium and vitamin D will help to protect your bones. Nutritional recommendations vary from person to person. Ask your health care provider what is healthy for you. Here are some general guidelines. Get enough calcium Calcium is the most important (essential) mineral for bone health. Most people can get enough calcium from their diet, but supplements may be recommended for people who are at risk for osteoporosis. Good sources of calcium include:  Dairy products, such as low-fat or nonfat milk, cheese, and yogurt.  Dark green leafy vegetables, such as bok choy and broccoli.  Calcium-fortified foods, such as orange juice, cereal, bread, soy beverages, and tofu products.  Nuts, such as almonds. Follow these recommended amounts for daily calcium intake:  Children, age 1-3: 700 mg.  Children, age 4-8: 1,000 mg.  Children, age 9-13: 1,300 mg.  Teens, age 14-18: 1,300 mg.  Adults, age 19-50: 1,000 mg.  Adults, age 51-70: ? Men: 1,000 mg. ? Women: 1,200 mg.  Adults, age 71 or older: 1,200 mg.  Pregnant and breastfeeding females: ? Teens: 1,300 mg. ? Adults: 1,000 mg. Get enough vitamin D Vitamin D is the most essential vitamin for bone health. It helps the body absorb calcium. Sunlight stimulates the skin to make vitamin D, so be sure to get enough sunlight. If you live in a cold climate or you do not get outside often, your health care provider may recommend that you take vitamin D supplements. Good sources of vitamin D in your diet include:  Egg yolks.  Saltwater fish.  Milk and cereal fortified with vitamin D. Follow these recommended amounts for daily vitamin D intake:  Children and teens, age 1-18: 600 international  units.  Adults, age 50 or younger: 400-800 international units.  Adults, age 51 or older: 800-1,000 international units. Get other important nutrients Other nutrients that are important for bone health include:  Phosphorus. This mineral is found in meat, poultry, dairy foods, nuts, and legumes. The recommended daily intake for adult men and adult women is 700 mg.  Magnesium. This mineral is found in seeds, nuts, dark   green vegetables, and legumes. The recommended daily intake for adult men is 400-420 mg. For adult women, it is 310-320 mg.  Vitamin K. This vitamin is found in green leafy vegetables. The recommended daily intake is 120 mg for adult men and 90 mg for adult women. What type of physical activity is best for building and maintaining healthy bones? Weight-bearing and strength-building activities are important for building and maintaining healthy bones. Weight-bearing activities cause muscles and bones to work against gravity. Strength-building activities increase the strength of the muscles that support bones. Weight-bearing and muscle-building activities include:  Walking and hiking.  Jogging and running.  Dancing.  Gym exercises.  Lifting weights.  Tennis and racquetball.  Climbing stairs.  Aerobics. Adults should get at least 30 minutes of moderate physical activity on most days. Children should get at least 60 minutes of moderate physical activity on most days. Ask your health care provider what type of exercise is best for you. How can I find out if my bone mass is low? Bone mass can be measured with an X-ray test called a bone mineral density (BMD) test. This test is recommended for all women who are age 65 or older. It may also be recommended for:  Men who are age 70 or older.  People who are at risk for osteoporosis because of: ? Having bones that break easily. ? Having a long-term disease that weakens bones, such as kidney disease or rheumatoid  arthritis. ? Having menopause earlier than normal. ? Taking medicine that weakens bones, such as steroids, thyroid hormones, or hormone treatment for breast cancer or prostate cancer. ? Smoking. ? Drinking three or more alcoholic drinks a day. If you find that you have a low bone mass, you may be able to prevent osteoporosis or further bone loss by changing your diet and lifestyle. Where can I find more information? For more information, check out the following websites:  National Osteoporosis Foundation: www.nof.org/patients  National Institutes of Health: www.bones.nih.gov  International Osteoporosis Foundation: www.iofbonehealth.org Summary  The aging process leads to an overall loss of bone mass in the body, which can increase the likelihood of broken bones and osteoporosis.  Eating a well-balanced diet with plenty of calcium and vitamin D will help to protect your bones.  Weight-bearing and strength-building activities are also important for building and maintaining strong bones.  Bone mass can be measured with an X-ray test called a bone mineral density (BMD) test. This information is not intended to replace advice given to you by your health care provider. Make sure you discuss any questions you have with your health care provider. Document Revised: 12/28/2017 Document Reviewed: 12/28/2017 Elsevier Patient Education  2020 Elsevier Inc.   

## 2020-08-07 NOTE — Progress Notes (Signed)
[facility]   Gynecology Annual Exam  PCP: Schermerhorn, Ihor Austin, MD  Chief Complaint:  Chief Complaint  Patient presents with  . Gynecologic Exam    annual exam    History of Present Illness: Patient is a 49 y.o. G2P2002 presents for annual exam. The patient has no complaints today.   LMP: Patient's last menstrual period was 06/17/2020. Average Interval: regular, 28 days Duration of flow: 5 days Heavy Menses: no Clots: no Intermenstrual Bleeding: no Postcoital Bleeding: no Dysmenorrhea: no   The patient is sexually active. She currently uses OCP (estrogen/progesterone) for contraception. She denies dyspareunia.  The patient does perform self breast exams.  There is no notable family history of breast or ovarian cancer in her family.  The patient denies current symptoms of depression.    Review of Systems: ROS  Past Medical History:  There are no problems to display for this patient.   Past Surgical History:  No past surgical history on file.  Gynecologic History:  Patient's last menstrual period was 06/17/2020. Contraception: OCP (estrogen/progesterone) Last Pap: Results were: 2020 NIL  Last mammogram: 2020 Results were: BI-RAD I  Obstetric History: [id number]  Family History:  Family History  Problem Relation Age of Onset  . Breast cancer Paternal Aunt     Social History:  Social History   Socioeconomic History  . Marital status: Married    Spouse name: Not on file  . Number of children: Not on file  . Years of education: Not on file  . Highest education level: Not on file  Occupational History  . Not on file  Tobacco Use  . Smoking status: Current Every Day Smoker  . Smokeless tobacco: Never Used  Vaping Use  . Vaping Use: Never used  Substance and Sexual Activity  . Alcohol use: Yes    Comment: occ  . Drug use: Never  . Sexual activity: Yes    Birth control/protection: Pill  Other Topics Concern  . Not on file  Social History Narrative  . Not on  file   Social Determinants of Health   Financial Resource Strain:   . Difficulty of Paying Living Expenses: Not on file  Food Insecurity:   . Worried About Programme researcher, broadcasting/film/video in the Last Year: Not on file  . Ran Out of Food in the Last Year: Not on file  Transportation Needs:   . Lack of Transportation (Medical): Not on file  . Lack of Transportation (Non-Medical): Not on file  Physical Activity:   . Days of Exercise per Week: Not on file  . Minutes of Exercise per Session: Not on file  Stress:   . Feeling of Stress : Not on file  Social Connections:   . Frequency of Communication with Friends and Family: Not on file  . Frequency of Social Gatherings with Friends and Family: Not on file  . Attends Religious Services: Not on file  . Active Member of Clubs or Organizations: Not on file  . Attends Banker Meetings: Not on file  . Marital Status: Not on file  Intimate Partner Violence:   . Fear of Current or Ex-Partner: Not on file  . Emotionally Abused: Not on file  . Physically Abused: Not on file  . Sexually Abused: Not on file    Allergies:  No Known Allergies  Medications: Prior to Admission medications   Medication Sig Start Date End Date Taking? Authorizing Provider  albuterol (VENTOLIN HFA) 108 (90 Base) MCG/ACT inhaler Inhale into the  lungs. 07/23/16   [provider]  CRYSELLE-28 0.3-30 MG-MCG tablet Take 1 tablet by mouth daily. 08/26/19   Natale Milch, MD    Physical Exam Vitals: Blood pressure 122/74, height 5\' 5"  (1.651 m), weight 148 lb 9.6 oz (67.4 kg), last menstrual period 06/17/2020.  General: NAD HEENT: normocephalic, anicteric Thyroid: no enlargement, no palpable nodules Pulmonary: No increased work of breathing, CTAB Cardiovascular: RRR, distal pulses 2+ Breast: Breast symmetrical, no tenderness, no palpable nodules or masses, no skin or nipple retraction present, no nipple discharge.  No axillary or supraclavicular  lymphadenopathy. Abdomen: NABS, soft, non-tender, non-distended.  Umbilicus without lesions.  No hepatomegaly, splenomegaly or masses palpable. No evidence of hernia  Genitourinary:  External: Normal external female genitalia.  Normal urethral meatus, normal Bartholin's and Skene's glands.    Vagina: Normal vaginal mucosa, no evidence of prolapse.    Cervix: Grossly normal in appearance, no bleeding  Uterus: Non-enlarged, mobile, normal contour.  No CMT  Adnexa: ovaries non-enlarged, no adnexal masses  Rectal: deferred  Lymphatic: no evidence of inguinal lymphadenopathy Extremities: no edema, erythema, or tenderness Neurologic: Grossly intact Psychiatric: mood appropriate, affect full  Female chaperone present for pelvic and breast  portions of the physical exam    Assessment: 49 y.o. G2P2002 routine annual exam  Plan: Problem List Items Addressed This Visit    None    Visit Diagnoses    Encounter for screening for lung cancer    -  Primary   Healthcare maintenance       Relevant Orders   CBC With Differential   Comprehensive metabolic panel   Lipid panel   TSH   T4, free   Vitamin D 25 hydroxy (Completed)   Encounter for screening mammogram for malignant neoplasm of breast       Encounter for gynecological examination without abnormal finding       Encounter for annual routine gynecological examination       Health maintenance examination       Encounter for initial prescription of contraceptive pills       Relevant Medications   CRYSELLE-28 0.3-30 MG-MCG tablet   History of tobacco use       Relevant Orders   DG Chest 2 View   Diabetes mellitus screening       Relevant Orders   Comprehensive metabolic panel   Screening for deficiency anemia       Relevant Orders   CBC With Differential   Screening for thyroid disorder       Relevant Orders   TSH   T4, free   Screening cholesterol level       Relevant Orders   Lipid panel      1) Mammogram - recommend  yearly screening mammogram.  Mammogram Is up to date   2) STI screening  was offered and declined  3) ASCCP guidelines and rational discussed.  Patient opts for every 5 years screening interval  4) Contraception - the patient is currently using  OCP (estrogen/progesterone).  She is happy with her current form of contraception and plans to continue  5) Colonoscopy -- Screening recommended starting at age 18 for average risk individuals, age 32 for individuals deemed at increased risk (including African Americans) and recommended to continue until age 65.  For patient age 39-85 individualized approach is recommended.  Gold standard screening is via colonoscopy, Cologuard screening is an acceptable alternative for patient unwilling or unable to undergo colonoscopy.  "Colorectal cancer screening for average?risk  adults: 2018 guideline update from the American Cancer Society"CA: A Cancer Journal for Clinicians: May 13, 2017   6) Routine healthcare maintenance including cholesterol, diabetes screening discussed Ordered today  7) Referral for lung cancer screening- started smoking 1993, Quit 8 months ago  8)  No follow-ups on file.   Natale Milch [facility] 08/21/2020, 9:25 AM

## 2020-08-10 ENCOUNTER — Other Ambulatory Visit: Payer: Self-pay

## 2020-08-10 ENCOUNTER — Other Ambulatory Visit: Payer: BC Managed Care – PPO

## 2020-08-10 DIAGNOSIS — Z01419 Encounter for gynecological examination (general) (routine) without abnormal findings: Secondary | ICD-10-CM | POA: Diagnosis not present

## 2020-08-10 DIAGNOSIS — Z122 Encounter for screening for malignant neoplasm of respiratory organs: Secondary | ICD-10-CM | POA: Diagnosis not present

## 2020-08-10 DIAGNOSIS — Z1231 Encounter for screening mammogram for malignant neoplasm of breast: Secondary | ICD-10-CM | POA: Diagnosis not present

## 2020-08-10 DIAGNOSIS — Z0000 Encounter for general adult medical examination without abnormal findings: Secondary | ICD-10-CM | POA: Diagnosis not present

## 2020-08-11 LAB — VITAMIN D 25 HYDROXY (VIT D DEFICIENCY, FRACTURES): Vit D, 25-Hydroxy: 31.7 ng/mL (ref 30.0–100.0)

## 2020-08-21 ENCOUNTER — Encounter: Payer: Self-pay | Admitting: Obstetrics and Gynecology

## 2020-09-25 ENCOUNTER — Ambulatory Visit
Admission: RE | Admit: 2020-09-25 | Discharge: 2020-09-25 | Disposition: A | Discharge status: Home / Self Care | Payer: BC Managed Care – PPO | Source: Ambulatory Visit | Attending: Obstetrics and Gynecology | Admitting: Obstetrics and Gynecology

## 2020-09-25 ENCOUNTER — Other Ambulatory Visit: Payer: Self-pay

## 2020-09-25 DIAGNOSIS — Z1231 Encounter for screening mammogram for malignant neoplasm of breast: Secondary | ICD-10-CM

## 2020-10-20 IMAGING — MG MM DIGITAL SCREENING BILAT W/ TOMO W/ CAD
8 series · 9 of 24 positions shown · non-contrast
Comparison: Previous exam(s).

CLINICAL DATA: Screening.

EXAM:
DIGITAL SCREENING BILATERAL MAMMOGRAM WITH TOMO AND CAD

[L MLO synth-2D]
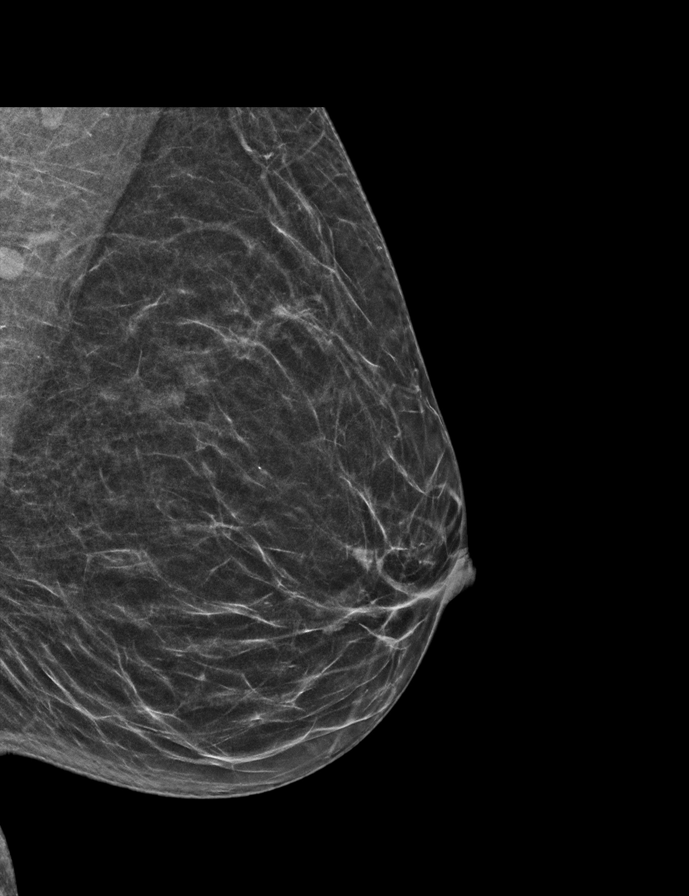

[R CC synth-2D]
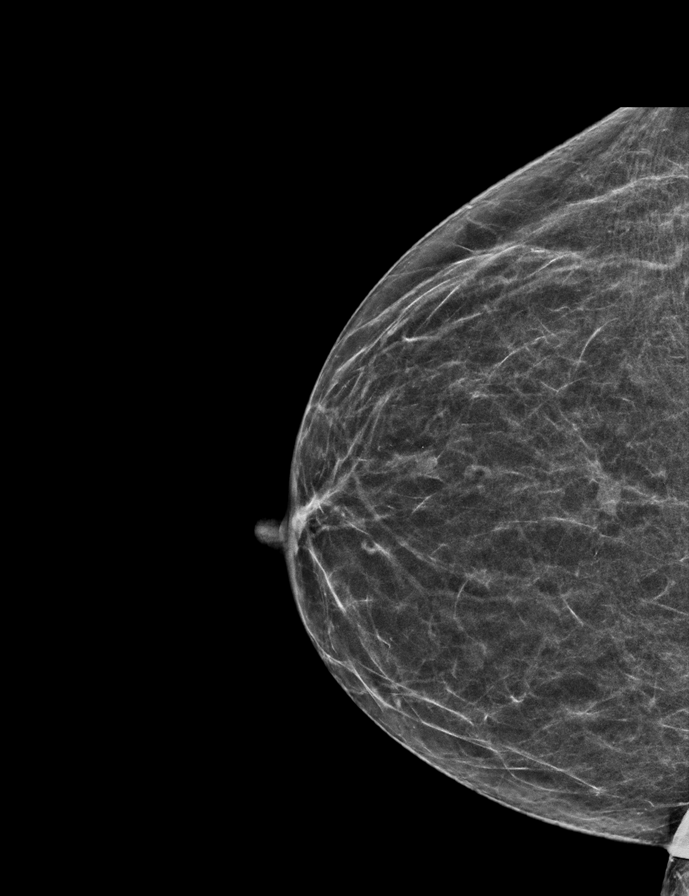

[L CC synth-2D]
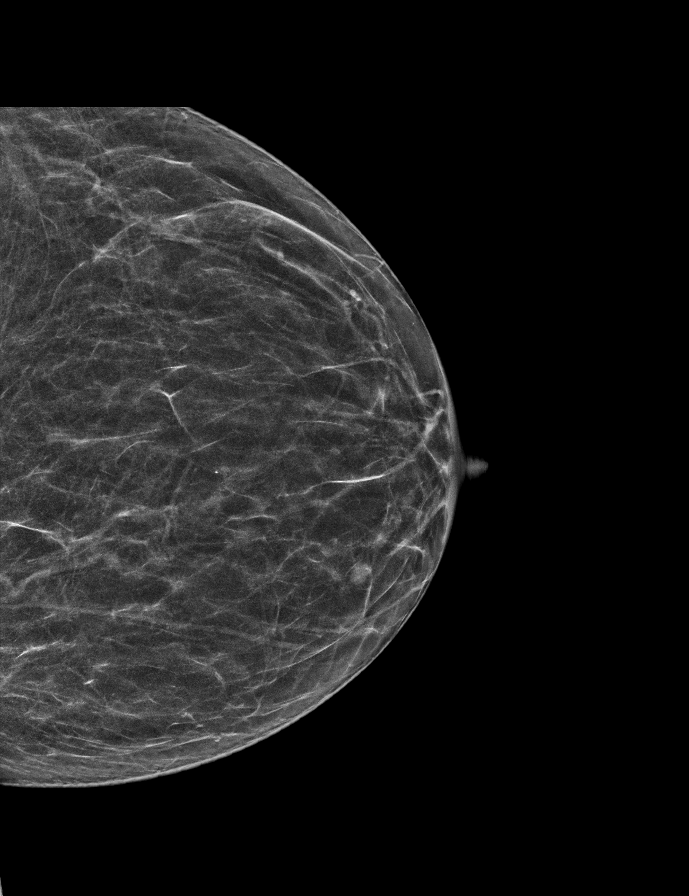

[R MLO synth-2D]
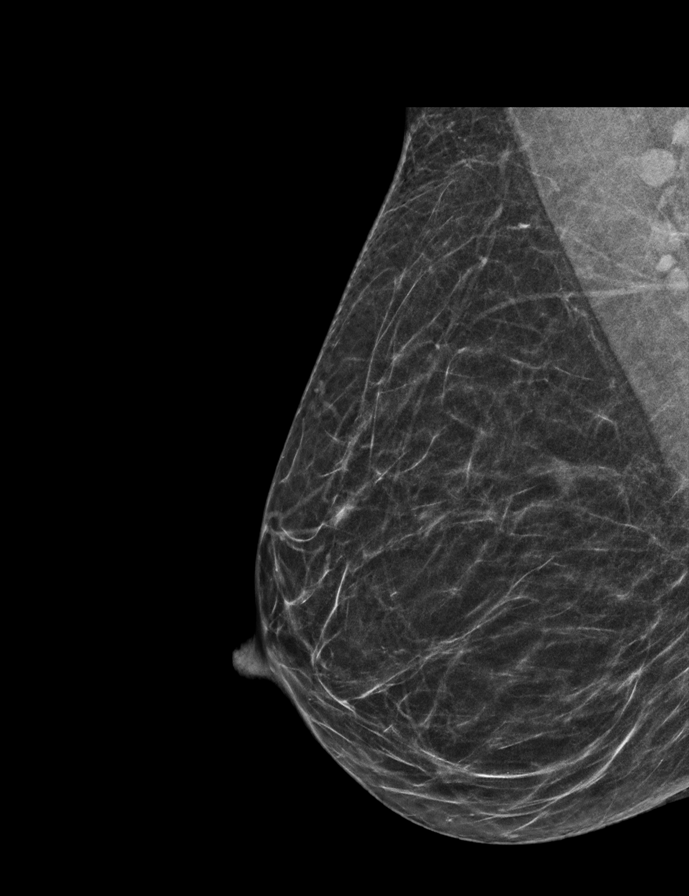

[L CC tomo · 2 of 49 frames shown]
[frame 16/49]
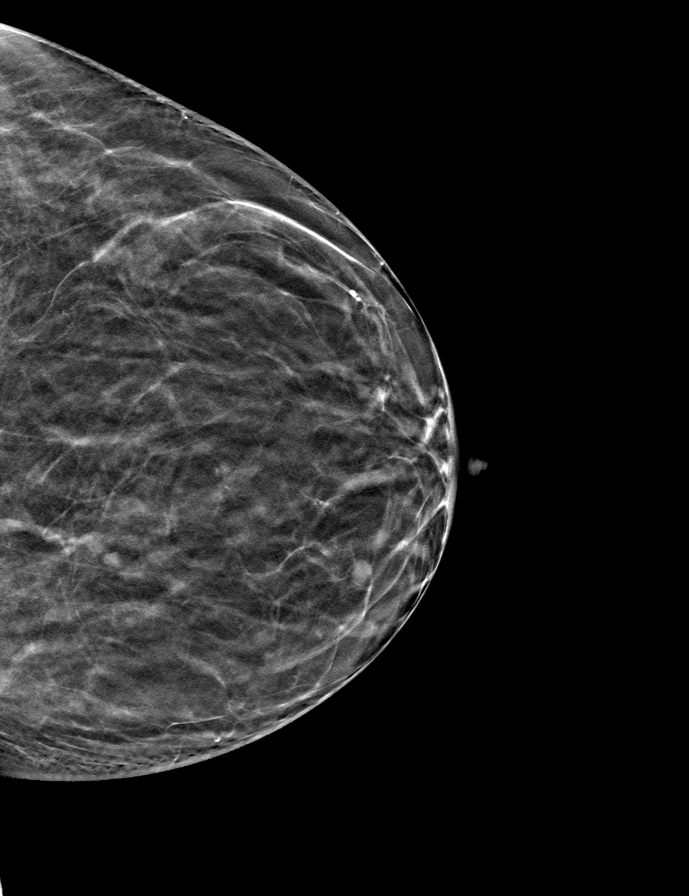
[frame 25/49]
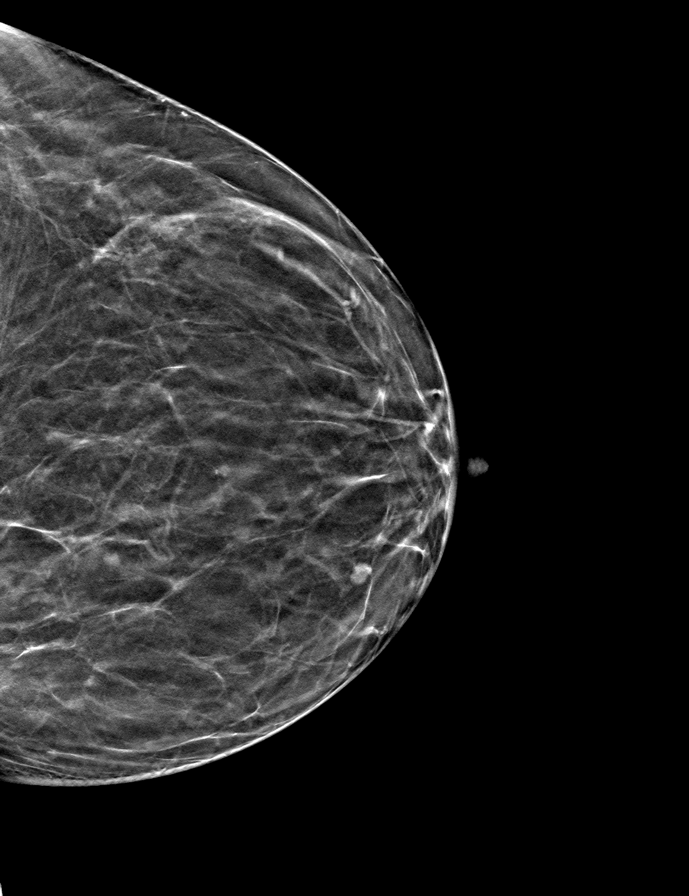

[R MLO tomo · tomo slice 25/49.0]
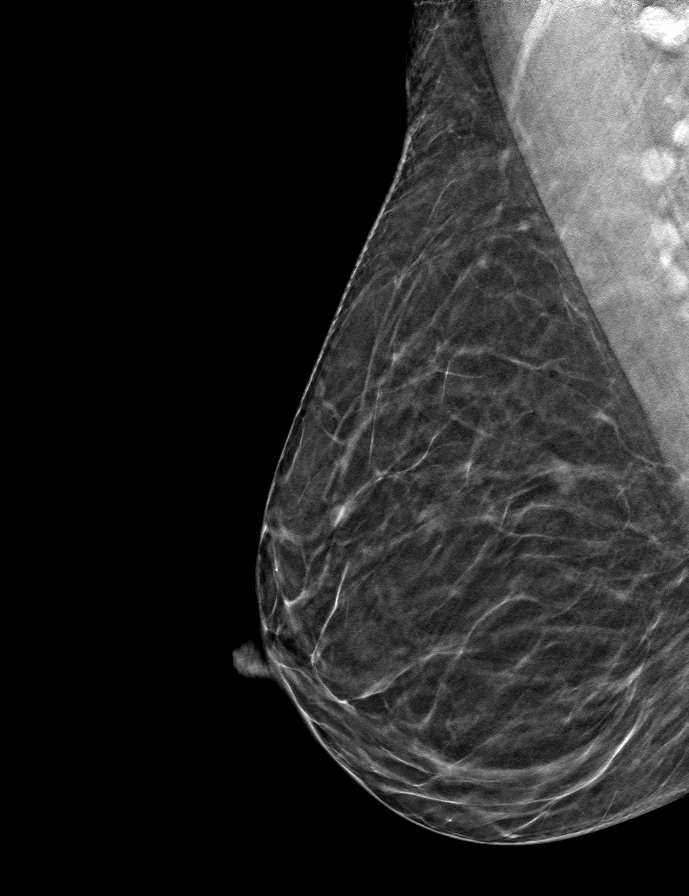

[L MLO tomo · tomo slice 25/48.0]
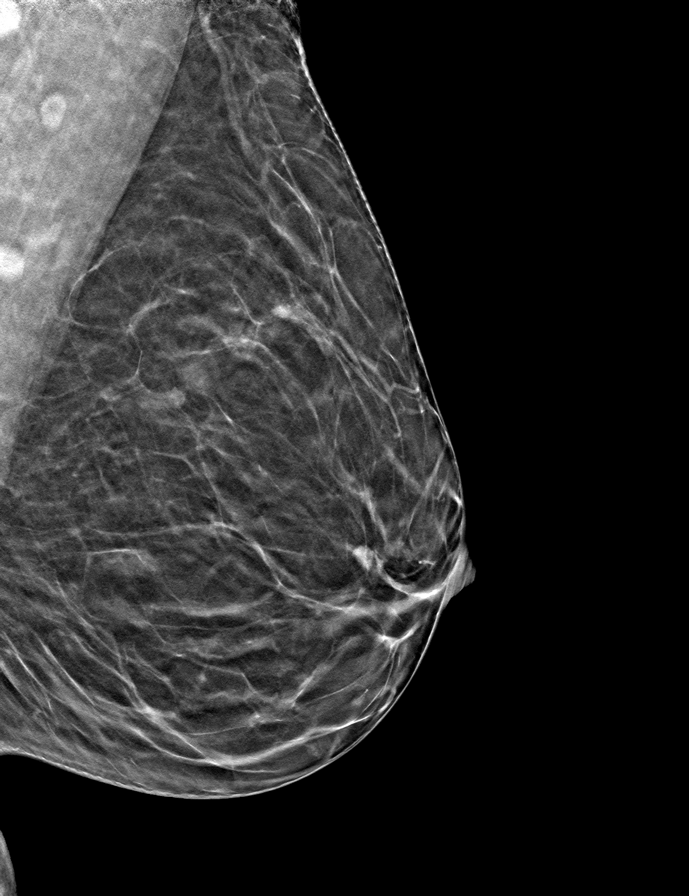

[R CC tomo · tomo slice 24/47.0]
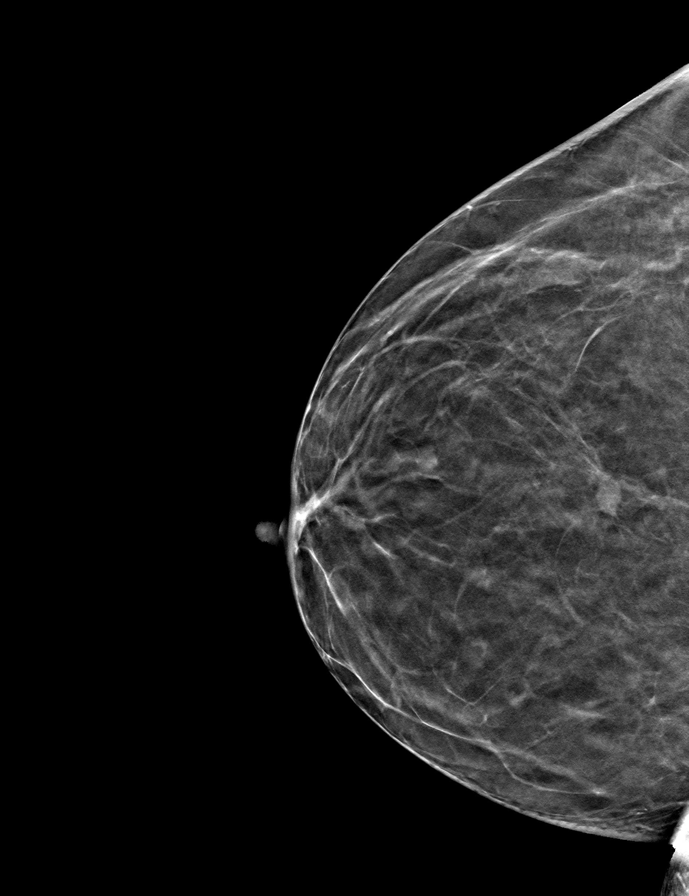

[9 of 24 positions shown; findings below may reference images not displayed]

ACR Breast Density Category b: There are scattered areas of
fibroglandular density.
FINDINGS: There are no findings suspicious for malignancy. Images were
processed with CAD.
IMPRESSION: No mammographic evidence of malignancy. A result letter of this
screening mammogram will be mailed directly to the patient.

RECOMMENDATION:
Screening mammogram in one year. (Code:[id number])

BI-RADS CATEGORY  1: Negative.

## 2020-10-23 ENCOUNTER — Telehealth: Payer: Self-pay

## 2020-10-23 NOTE — Telephone Encounter (Signed)
Pt calling; at her last visit CRS suggested she could be a candidate for a CT Scan d/t her family hx.  Pt has not heard anything from anyone about this.  Pt is not sure her ins will cover a CT Scan.  [phone]  Courtesy call to let pt know CRS is not in office today but will be here tomorrow.  I will send this msg to her.  Pt can be reached at this same number tomorrow.

## 2020-10-25 ENCOUNTER — Other Ambulatory Visit: Payer: Self-pay | Admitting: Obstetrics and Gynecology

## 2020-10-25 DIAGNOSIS — Z87891 Personal history of nicotine dependence: Secondary | ICD-10-CM

## 2020-10-25 NOTE — Telephone Encounter (Signed)
This is for lung cancer screening- I left a message on her voicemail. I will place the referral. Thank you!

## 2020-11-05 ENCOUNTER — Telehealth: Payer: Self-pay

## 2020-11-05 DIAGNOSIS — Z122 Encounter for screening for malignant neoplasm of respiratory organs: Secondary | ICD-10-CM

## 2020-11-05 DIAGNOSIS — Z87891 Personal history of nicotine dependence: Secondary | ICD-10-CM

## 2020-11-05 NOTE — Telephone Encounter (Signed)
Contacted patient for lung CT screening clinic based on referral from [facility].  I left message for patient to call Glenna Fellows, lung navigator to schedule CT scan.

## 2020-11-06 NOTE — Addendum Note (Signed)
Addended by: Jonne Ply on: 11/06/2020 02:48 PM   Modules accepted: Orders

## 2020-11-06 NOTE — Telephone Encounter (Signed)
Received referral for initial lung cancer screening scan. Contacted patient and obtained smoking history,(former, quit 06/15/2019, 22 pack year history) as well as answering questions related to screening process. Patient denies signs of lung cancer such as weight loss or hemoptysis. Patient denies comorbidity that would prevent curative treatment if lung cancer were found. Patient is scheduled for shared decision making visit and CT scan on 11/14/20.

## 2020-11-14 ENCOUNTER — Inpatient Hospital Stay: Payer: BC Managed Care – PPO | Attending: Oncology | Admitting: Oncology

## 2020-11-14 ENCOUNTER — Other Ambulatory Visit: Payer: Self-pay

## 2020-11-14 ENCOUNTER — Ambulatory Visit
Admission: RE | Admit: 2020-11-14 | Discharge: 2020-11-14 | Disposition: A | Discharge status: Home / Self Care | Payer: BC Managed Care – PPO | Source: Ambulatory Visit | Attending: Oncology | Admitting: Oncology

## 2020-11-14 DIAGNOSIS — Z122 Encounter for screening for malignant neoplasm of respiratory organs: Secondary | ICD-10-CM | POA: Insufficient documentation

## 2020-11-14 DIAGNOSIS — Z87891 Personal history of nicotine dependence: Secondary | ICD-10-CM | POA: Diagnosis not present

## 2020-11-14 NOTE — Progress Notes (Signed)
Virtual Visit via Video Note  I connected with Molly [name] on 11/14/20 at  1:30 PM EST by a video enabled telemedicine application and verified that I am speaking with the correct person using two identifiers.  Location: Patient: Work Provider: Clinic   I discussed the limitations of evaluation and management by telemedicine and the availability of in person appointments. The patient expressed understanding and agreed to proceed.  I discussed the assessment and treatment plan with the patient. The patient was provided an opportunity to ask questions and all were answered. The patient agreed with the plan and demonstrated an understanding of the instructions.   The patient was advised to call back or seek an in-person evaluation if the symptoms worsen or if the condition fails to improve as anticipated.   In accordance with CMS guidelines, patient has met eligibility criteria including age, absence of signs or symptoms of lung cancer.  Social History   Tobacco Use  . Smoking status: Current Every Day Smoker  . Smokeless tobacco: Never Used  Vaping Use  . Vaping Use: Never used  Substance Use Topics  . Alcohol use: Yes    Comment: occ  . Drug use: Never      A shared decision-making session was conducted prior to the performance of CT scan. This includes one or more decision aids, includes benefits and harms of screening, follow-up diagnostic testing, over-diagnosis, false positive rate, and total radiation exposure.   Counseling on the importance of adherence to annual lung cancer LDCT screening, impact of co-morbidities, and ability or willingness to undergo diagnosis and treatment is imperative for compliance of the program.   Counseling on the importance of continued smoking cessation for former smokers; the importance of smoking cessation for current smokers, and information about tobacco cessation interventions have been given to patient including [place] and [address]  quit [place] programs.   Written order for lung cancer screening with LDCT has been given to the patient and any and all questions have been answered to the best of my abilities.    Yearly follow up will be coordinated by Burgess Estelle, Thoracic Navigator.  I provided 15 minutes of face-to-face video visit time during this encounter, and > 50% was spent counseling as documented under my assessment & plan.   Jacquelin Hawking, NP

## 2020-11-15 ENCOUNTER — Encounter: Payer: Self-pay | Admitting: *Deleted

## 2020-11-16 ENCOUNTER — Telehealth: Payer: Self-pay | Admitting: *Deleted

## 2020-11-16 NOTE — Telephone Encounter (Signed)
Pt made aware of LDCT results and recommendations for annual screening. All questions answered during call.

## 2021-02-27 ENCOUNTER — Telehealth: Payer: Self-pay

## 2021-02-27 NOTE — Telephone Encounter (Signed)
Pt left msg on triage saying she has been bleeding since Sun morning. Did not have a period last month and is early one week this month with her cycle. Says she had a similar situation in the 1990s where she started bleeding and did not stop. Had D&C done, turned out to be she wasn't ovulating. Wonders if same thing is happening this time. Says she is using a panty liner and changing it once a day, no abnormal pain. Advised of our protocol:if she has to change full pad/tampon more than once an hr to go to ER. Advised Dr Jerene Pitch is not in the office today, returns tomorrow and that msg will be sent to her. Please advise.

## 2021-02-28 ENCOUNTER — Other Ambulatory Visit: Payer: Self-pay | Admitting: Obstetrics and Gynecology

## 2021-02-28 DIAGNOSIS — N939 Abnormal uterine and vaginal bleeding, unspecified: Secondary | ICD-10-CM

## 2021-02-28 NOTE — Telephone Encounter (Signed)
Called and left voicemail for patient to call back to be scheduled. 

## 2021-02-28 NOTE — Telephone Encounter (Signed)
Please schedule this patient for a pelvic US and a visit to be seen. Thank you- Dr. Jerene Pitch

## 2021-03-04 ENCOUNTER — Ambulatory Visit
Admission: RE | Admit: 2021-03-04 | Discharge: 2021-03-04 | Disposition: A | Discharge status: Home / Self Care | Payer: BC Managed Care – PPO | Source: Ambulatory Visit | Attending: Obstetrics and Gynecology | Admitting: Obstetrics and Gynecology

## 2021-03-04 ENCOUNTER — Other Ambulatory Visit: Payer: Self-pay

## 2021-03-04 DIAGNOSIS — N939 Abnormal uterine and vaginal bleeding, unspecified: Secondary | ICD-10-CM

## 2021-03-05 ENCOUNTER — Encounter: Payer: Self-pay | Admitting: Obstetrics and Gynecology

## 2021-03-05 ENCOUNTER — Other Ambulatory Visit (HOSPITAL_COMMUNITY)
Admission: RE | Admit: 2021-03-05 | Discharge: 2021-03-05 | Disposition: A | Discharge status: Home / Self Care | Payer: BC Managed Care – PPO | Source: Ambulatory Visit | Attending: Obstetrics and Gynecology | Admitting: Obstetrics and Gynecology

## 2021-03-05 ENCOUNTER — Ambulatory Visit (INDEPENDENT_AMBULATORY_CARE_PROVIDER_SITE_OTHER): Payer: BC Managed Care – PPO | Admitting: Obstetrics and Gynecology

## 2021-03-05 VITALS — BP 121/70 | Ht 65.0 in | Wt 143.0 lb

## 2021-03-05 DIAGNOSIS — N889 Noninflammatory disorder of cervix uteri, unspecified: Secondary | ICD-10-CM | POA: Diagnosis not present

## 2021-03-05 DIAGNOSIS — Z124 Encounter for screening for malignant neoplasm of cervix: Secondary | ICD-10-CM | POA: Diagnosis not present

## 2021-03-05 NOTE — Progress Notes (Signed)
Patient ID: Molly [name], female   DOB: September 14, 1971, 50 y.o.   MRN: [id number]  Reason for Consult: Follow-up   Referred by Schermerhorn, Ihor Austin,*  Subjective:     HPI:  Molly [name] is a 50 y.o. female she presents today for a visit regarding abnormal uterine bleeding.  She reports that last weekend she started having a heavy menstrual cycle flow.  She reports that this bleeding occurred in the middle of taking her oral contraceptive pills during her active pill weeks.  She reports that the bleeding would start a flight and get heavier throughout the day.  The bleeding was enough of bright red bleeding to fill a panty liner.  She reports that as of yesterday the bleeding started to slow down and today she has not noticed any bleeding.  She had a pelvic ultrasound at the hospital which revealed a grossly normal uterus with a 3 mm endometrial lining.  No evidence of fibroids or polyps was seen.  She denies any bleeding after intercourse.  She reports that she is experiencing some symptoms of decreased libido.  She reports that when she was in her 45s she had a similar episode of irregular bleeding.  She had a D&C and is given some medication which helped decrease this.  She has always taken an oral birth control pill to help control her menstrual cycles and she is hesitant to discontinue her oral birth control therapy at this time.  Gynecological History  Patient's last menstrual period was 02/24/2021.  Obstetrical History OB History  Gravida Para Term Preterm AB Living  2 2 2     2   SAB IAB Ectopic Multiple Live Births          2    # Outcome Date GA Lbr Len/2nd Weight Sex Delivery Anes PTL Lv  2 Term           1 Term              No past medical history on file. Family History  Problem Relation Age of Onset  . Breast cancer Paternal Aunt    No past surgical history on file.  Short Social History:  Social History   Tobacco Use  . Smoking status: Current Every Day  Smoker  . Smokeless tobacco: Never Used  Substance Use Topics  . Alcohol use: Yes    Comment: occ    No Known Allergies  Current Outpatient Medications  Medication Sig Dispense Refill  . albuterol (VENTOLIN HFA) 108 (90 Base) MCG/ACT inhaler Inhale into the lungs.    . CRYSELLE-28 0.3-30 MG-MCG tablet Take 1 tablet by mouth daily. 28 tablet 11   No current facility-administered medications for this visit.    Review of Systems  Constitutional: Negative for chills, fatigue, fever and unexpected weight change.  HENT: Negative for trouble swallowing.  Eyes: Negative for loss of vision.  Respiratory: Negative for cough, shortness of breath and wheezing.  Cardiovascular: Negative for chest pain, leg swelling, palpitations and syncope.  GI: Negative for abdominal pain, blood in stool, diarrhea, nausea and vomiting.  GU: Negative for difficulty urinating, dysuria, frequency and hematuria.  Musculoskeletal: Negative for back pain, leg pain and joint pain.  Skin: Negative for rash.  Neurological: Negative for dizziness, headaches, light-headedness, numbness and seizures.  Psychiatric: Negative for behavioral problem, confusion, depressed mood and sleep disturbance.        Objective:  Objective   Vitals:   03/05/21 1110  BP: 121/70  Weight: 143 lb (64.9 kg)  Height: 5\' 5"  (1.651 m)   Body mass index is 23.8 kg/m.  Physical Exam Vitals and nursing note reviewed. Exam conducted with a chaperone present.  Constitutional:      Appearance: Normal appearance. She is well-developed.  HENT:     Head: Normocephalic and atraumatic.  Eyes:     Extraocular Movements: Extraocular movements intact.     Pupils: Pupils are equal, round, and reactive to light.  Cardiovascular:     Rate and Rhythm: Normal rate and regular rhythm.  Pulmonary:     Effort: Pulmonary effort is normal. No respiratory distress.     Breath sounds: Normal breath sounds.  Abdominal:     General: Abdomen is flat.      Palpations: Abdomen is soft.  Genitourinary:    Comments: External: Normal appearing vulva. No lesions noted.  Speculum examination: Friable appearing cervix and small vessels. No blood in the vaginal vault.   Musculoskeletal:        General: No signs of injury.  Skin:    General: Skin is warm and dry.  Neurological:     Mental Status: She is alert and oriented to person, place, and time.  Psychiatric:        Behavior: Behavior normal.        Thought Content: Thought content normal.        Judgment: Judgment normal.     Assessment/Plan:     50 yo with abnormal uterine bleeding. Thin endometrium with irregular beeding Continue pill pack and do not have hormone free pill week next week.  Recommended she consider stopping the OCP since she may not need to take OCPs at all and may be entering menopausal transition.  No endometrial sampling at this time but if the bleeding continues then patient should return.   Abnormal appearance of cervix, pap smear collected, consider biopsy if needed  Return if the irregular bleeding continues.   More than 20 minutes were spent face to face with the patient in the room, reviewing the medical record, labs and images, and coordinating care for the patient. The plan of management was discussed in detail and counseling was provided.     44 MD [facility], [facility] 03/05/2021 11:30 AM

## 2021-03-06 LAB — CYTOLOGY - PAP
Comment: NEGATIVE
Diagnosis: NEGATIVE
High risk HPV: NEGATIVE

## 2021-04-24 ENCOUNTER — Telehealth: Payer: Self-pay

## 2021-04-24 NOTE — Telephone Encounter (Signed)
Pt calling; was seen a month ago; Her dad passed away last Wed.  Thurs it was like her body was drained from anxiety, stress and being upset; hasn't been drinking as much water as usual and hasn't been eating as good; yesterday started having to pee more; is taking AZO but still has pressure; would like rx of Bactrim called in to [facility] [address]  [phone]

## 2021-04-25 NOTE — Telephone Encounter (Signed)
Patient unable to do office visit. I offered for patient to bring by a urine drop off. She said she could do that. Patient is scheduled for tomorrow 04/25/21 at 10 am

## 2021-04-26 ENCOUNTER — Ambulatory Visit: Payer: BC Managed Care – PPO

## 2021-08-09 ENCOUNTER — Encounter: Payer: Self-pay | Admitting: Obstetrics and Gynecology

## 2021-08-09 ENCOUNTER — Other Ambulatory Visit: Payer: Self-pay

## 2021-08-09 ENCOUNTER — Ambulatory Visit (INDEPENDENT_AMBULATORY_CARE_PROVIDER_SITE_OTHER): Payer: BC Managed Care – PPO | Admitting: Obstetrics and Gynecology

## 2021-08-09 VITALS — BP 114/60 | Ht 64.0 in | Wt 143.0 lb

## 2021-08-09 DIAGNOSIS — Z1211 Encounter for screening for malignant neoplasm of colon: Secondary | ICD-10-CM

## 2021-08-09 DIAGNOSIS — Z0000 Encounter for general adult medical examination without abnormal findings: Secondary | ICD-10-CM | POA: Diagnosis not present

## 2021-08-09 DIAGNOSIS — Z3041 Encounter for surveillance of contraceptive pills: Secondary | ICD-10-CM | POA: Diagnosis not present

## 2021-08-09 DIAGNOSIS — Z30011 Encounter for initial prescription of contraceptive pills: Secondary | ICD-10-CM

## 2021-08-09 DIAGNOSIS — Z01419 Encounter for gynecological examination (general) (routine) without abnormal findings: Secondary | ICD-10-CM | POA: Diagnosis not present

## 2021-08-09 DIAGNOSIS — Z1231 Encounter for screening mammogram for malignant neoplasm of breast: Secondary | ICD-10-CM

## 2021-08-09 DIAGNOSIS — Z87891 Personal history of nicotine dependence: Secondary | ICD-10-CM

## 2021-08-09 MED ORDER — CRYSELLE-28 0.3-30 MG-MCG PO TABS: 1.0000 | ORAL_TABLET | Freq: Every day | ORAL | 11 refills | Status: DC

## 2021-08-09 NOTE — Patient Instructions (Addendum)
Institute of Medicine Recommended Dietary Allowances for Calcium and Vitamin D  Age (yr) Calcium Recommended Dietary Allowance (mg/day) Vitamin D Recommended Dietary Allowance (international units/day)  9-18 1,300 600  19-50 1,000 600  51-70 1,200 600  71 and older 1,200 800  Data from Institute of Medicine. Dietary reference intakes: calcium, vitamin D. [place]: [employer]; 2011.   Saralyn Pilar, DO  [phone]  [facility] [address] [place], [place] [address]   Shirlee Latch, MD [phone]  [employer] [address] [place], [place] [address]     Exercising to Stay Healthy To become healthy and stay healthy, it is recommended that you do moderate-intensity and vigorous-intensity exercise. You can tell that you are exercising at a moderate intensity if your heart starts beating faster and you start breathing faster but can still hold a conversation. You can tell that you are exercising at a vigorous intensity if you are breathing much harder andfaster and cannot hold a conversation while exercising. Exercising regularly is important. It has many health benefits, such as: Improving overall fitness, flexibility, and endurance. Increasing bone density. Helping with weight control. Decreasing body fat. Increasing muscle strength. Reducing stress and tension. Improving overall health. How often should I exercise? Choose an activity that you enjoy, and set realistic goals. Your health careprovider can help you make an activity plan that works for you. Exercise regularly as told by your health care provider. This may include: Doing strength training two times a week, such as: Lifting weights. Using resistance bands. Push-ups. Sit-ups. Yoga. Doing a certain intensity of exercise for a given amount of time. Choose from these options: A total of 150 minutes of moderate-intensity exercise every  week. A total of 75 minutes of vigorous-intensity exercise every week. A mix of moderate-intensity and vigorous-intensity exercise every week. Children, pregnant women, people who have not exercised regularly, people who are overweight, and older adults may need to talk with a health care provider about what activities are safe to do. If you have a medical condition, be sureto talk with your health care provider before you start a new exercise program. What are some exercise ideas? Moderate-intensity exercise ideas include: Walking 1 mile (1.6 km) in about 15 minutes. Biking. Hiking. Golfing. Dancing. Water aerobics. Vigorous-intensity exercise ideas include: Walking 4.5 miles (7.2 km) or more in about 1 hour. Jogging or running 5 miles (8 km) in about 1 hour. Biking 10 miles (16.1 km) or more in about 1 hour. Lap swimming. Roller-skating or in-line skating. Cross-country skiing. Vigorous competitive sports, such as football, basketball, and soccer. Jumping rope. Aerobic dancing. What are some everyday activities that can help me to get exercise? Yard work, such as: Child psychotherapist. Raking and bagging leaves. Washing your car. Pushing a stroller. Shoveling snow. Gardening. Washing windows or floors. How can I be more active in my day-to-day activities? Use stairs instead of an elevator. Take a walk during your lunch break. If you drive, park your car farther away from your work or school. If you take public transportation, get off one stop early and walk the rest of the way. Stand up or walk around during all of your indoor phone calls. Get up, stretch, and walk around every 30 minutes throughout the day. Enjoy exercise with a friend. Support to continue exercising will help you keep a regular routine of activity. What guidelines can I follow while exercising? Before you start a new exercise program, talk with  your health care provider. Do not exercise so much that you  hurt yourself, feel dizzy, or get very short of breath. Wear comfortable clothes and wear shoes with good support. Drink plenty of water while you exercise to prevent dehydration or heat stroke. Work out until your breathing and your heartbeat get faster. Where to find more information U.S. Department of Health and Human Services: ThisPath.fiwww.hhs.gov Centers for Disease Control and Prevention (CDC): FootballExhibition.com.brwww.cdc.gov Summary Exercising regularly is important. It will improve your overall fitness, flexibility, and endurance. Regular exercise also will improve your overall health. It can help you control your weight, reduce stress, and improve your bone density. Do not exercise so much that you hurt yourself, feel dizzy, or get very short of breath. Before you start a new exercise program, talk with your health care provider. This information is not intended to replace advice given to you by your health care provider. Make sure you discuss any questions you have with your healthcare provider. Document Revised: 11/16/2020 Document Reviewed: 11/16/2020 Elsevier Patient Education  2022 Elsevier Inc. Budget-Friendly Healthy Eating There are many ways to save money at the grocery store and continue to eat healthy. You can be successful if you: Plan meals according to your budget. Make a grocery list and only purchase food according to your grocery list. Prepare food yourself at home. What are tips for following this plan? Reading food labels Compare food labels between brand name foods and the store brand. Often the nutritional value is the same, but the store brand is lower cost. Look for products that do not have added sugar, fat, or salt (sodium). These often cost the same but are healthier for you. Products may be labeled as: Sugar-free. Nonfat. Low-fat. Sodium-free. Low-sodium. Look for lean ground beef labeled as at least 92% lean and 8% fat. Shopping  Buy only the items on your grocery list and go  only to the areas of the store that have the items on your list. Use coupons only for foods and brands you normally buy. Avoid buying items you wouldn't normally buy simply because they are on sale. Check online and in newspapers for weekly deals. Buy healthy items from the bulk bins when available, such as herbs, spices, flour, pasta, nuts, and dried fruit. Buy fruits and vegetables that are in season. Prices are usually lower on in-season produce. Look at the unit price on the price tag. Use it to compare different brands and sizes to find out which item is the best deal. Choose healthy items that are often low-cost, such as carrots, potatoes, apples, bananas, and oranges. Dried or canned beans are a low-cost protein source. Buy in bulk and freeze extra food. Items you can buy in bulk include meats, fish, poultry, frozen fruits, and frozen vegetables. Avoid buying "ready-to-eat" foods, such as pre-cut fruits and vegetables and pre-made salads. If possible, shop around to discover where you can find the best prices. Consider other retailers such as dollar stores, larger [employer] Corporationwholesale stores, local fruit and vegetable stands, and farmers markets. Do not shop when you are hungry. If you shop while hungry, it may be hard to stick to your list and budget. Resist impulse buying. Use your grocery list as your official plan for the week. Buy a variety of vegetables and fruits by purchasing fresh, frozen, and canned items. Look at the top and bottom shelves for deals. Foods at eye level (eye level of an adult or child) are usually more expensive. Be efficient with your  time when shopping. The more time you spend at the store, the more money you are likely to spend. To save money when choosing more expensive foods like meats and dairy: Choose cheaper cuts of meat, such as bone-in chicken thighs and drumsticks instead of skinless and boneless chicken. When you are ready to prepare the chicken, you can remove the  skin yourself to make it healthier. Choose lean meats like chicken or [place] instead of beef. Choose canned seafood, such as tuna, salmon, or sardines. Buy eggs as a low-cost source of protein. Buy dried beans and peas, such as lentils, split peas, or kidney beans instead of meats. Dried beans and peas are a good alternative source of protein. Buy the larger tubs of yogurt instead of individual-sized containers. Choose water instead of sodas and other sweetened beverages. Avoid buying chips, cookies, and other "junk food." These items are usually expensive and not healthy.  Cooking Make extra food and freeze the extras in meal-sized containers or in individual portions for fast meals and snacks. Pre-cook on days when you have extra time to prepare meals in advance. You can keep these meals in the fridge or freezer and reheat for a quick meal. When you come home from the grocery store, wash, peel, and cut fruits and vegetables so they are ready to use and eat. This will help reduce food waste. Meal planning Do not eat out or get fast food. Prepare food at home. Make a grocery list and make sure to bring it with you to the store. If you have a smart phone, you could use your phone to create your shopping list. Plan meals and snacks according to a grocery list and budget you create. Use leftovers in your meal plan for the week. Look for recipes where you can cook once and make enough food for two meals. Prepare budget-friendly types of meals like stews, casseroles, and stir-fry dishes. Try some meatless meals or try "no cook" meals like salads. Make sure that half your plate is filled with fruits or vegetables. Choose from fresh, frozen, or canned fruits and vegetables. If eating canned, remember to rinse them before eating. This will remove any excess salt added for packaging. Summary Eating healthy on a budget is possible if you plan your meals according to your budget, purchase according to  your budget and grocery list, and prepare food yourself. Tips for buying more food on a limited budget include buying generic brands, using coupons only for foods you normally buy, and buying healthy items from the bulk bins when available. Tips for buying cheaper food to replace expensive food include choosing cheaper, lean cuts of meat, and buying dried beans and peas. This information is not intended to replace advice given to you by your health care provider. Make sure you discuss any questions you have with your healthcare provider. Document Revised: 09/13/2020 Document Reviewed: 09/13/2020 Elsevier Patient Education  2022 Elsevier Inc. Bone Health Bones protect organs, store calcium, anchor muscles, and support the whole body. Keeping your bones strong is important, especially as you get older. Youcan take actions to help keep your bones strong and healthy. Why is keeping my bones healthy important?  Keeping your bones healthy is important because your body constantly replaces bone cells. Cells get old, and new cells take their place. As we age, we lose bone cells because the body may not be able to make enough new cells to replace the old cells. The amount of bone cells and bone  tissue you have is referred toas bone mass. The higher your bone mass, the stronger your bones. The aging process leads to an overall loss of bone mass in the body, which can increase the likelihood of: Joint pain and stiffness. Broken bones. A condition in which the bones become weak and brittle (osteoporosis). A large decline in bone mass occurs in older adults. In women, it occurs aboutthe time of menopause. What actions can I take to keep my bones healthy? Good health habits are important for maintaining healthy bones. This includes eating nutritious foods and exercising regularly. To have healthy bones, you need to get enough of the right minerals and vitamins. Most nutrition experts recommend getting these  nutrients from the foods that you eat. In some cases, taking supplements may also be recommended. Doing certain types of exercise isalso important for bone health. What are the nutritional recommendations for healthy bones?  Eating a well-balanced diet with plenty of calcium and vitamin D will help to protect your bones. Nutritional recommendations vary from person to person. Ask your health care provider what is healthy for you. Here are some generalguidelines. Get enough calcium Calcium is the most important (essential) mineral for bone health. Most people can get enough calcium from their diet, but supplements may be recommended for people who are at risk for osteoporosis. Good sources of calcium include: Dairy products, such as low-fat or nonfat milk, cheese, and yogurt. Dark green leafy vegetables, such as bok choy and broccoli. Calcium-fortified foods, such as orange juice, cereal, bread, soy beverages, and tofu products. Nuts, such as almonds. Follow these recommended amounts for daily calcium intake: Children, age 32-3: 700 mg. Children, age 21-8: 1,000 mg. Children, age 216-13: 1,300 mg. Teens, age 2-18: 1,300 mg. Adults, age 51-50: 1,000 mg. Adults, age 81-70: Men: 1,000 mg. Women: 1,200 mg. Adults, age 2 or older: 1,200 mg. Pregnant and breastfeeding females: Teens: 1,300 mg. Adults: 1,000 mg. Get enough vitamin D Vitamin D is the most essential vitamin for bone health. It helps the body absorb calcium. Sunlight stimulates the skin to make vitamin D, so be sure to get enough sunlight. If you live in a cold climate or you do not get outside often, your health care provider may recommend that you take vitamin D supplements. Good sources of vitamin D in your diet include: Egg yolks. Saltwater fish. Milk and cereal fortified with vitamin D. Follow these recommended amounts for daily vitamin D intake: Children and teens, age 32-18: 600 international units. Adults, age 34 or younger:  400-800 international units. Adults, age 63 or older: 800-1,000 international units. Get other important nutrients Other nutrients that are important for bone health include: Phosphorus. This mineral is found in meat, poultry, dairy foods, nuts, and legumes. The recommended daily intake for adult men and adult women is 700 mg. Magnesium. This mineral is found in seeds, nuts, dark green vegetables, and legumes. The recommended daily intake for adult men is 400-420 mg. For adult women, it is 310-320 mg. Vitamin K. This vitamin is found in green leafy vegetables. The recommended daily intake is 120 mg for adult men and 90 mg for adult women. What type of physical activity is best for building and maintaining healthybones? Weight-bearing and strength-building activities are important for building and maintaining healthy bones. Weight-bearing activities cause muscles and bones to work against gravity. Strength-building activities increase the strength of the muscles that support bones. Weight-bearing and muscle-building activities include: Walking and hiking. Jogging and running. Dancing. Gym exercises.  Lifting weights. Tennis and racquetball. Climbing stairs. Aerobics. Adults should get at least 30 minutes of moderate physical activity on most days. Children should get at least 60 minutes of moderate physical activity onmost days. Ask your health care provider what type of exercise is best for you. How can I find out if my bone mass is low? Bone mass can be measured with an X-ray test called a bone mineral density (BMD) test. This test is recommended for all women who are age 65 or older. It may also be recommended for: Men who are age 17 or older. People who are at risk for osteoporosis because of: Having bones that break easily. Having a long-term disease that weakens bones, such as kidney disease or rheumatoid arthritis. Having menopause earlier than normal. Taking medicine that weakens  bones, such as steroids, thyroid hormones, or hormone treatment for breast cancer or prostate cancer. Smoking. Drinking three or more alcoholic drinks a day. If you find that you have a low bone mass, you may be able to preventosteoporosis or further bone loss by changing your diet and lifestyle. Where can I find more information? For more information, check out the following websites: National Osteoporosis Foundation: https://carlson-fletcher.info/ [employer]: www.bones.http://www.myers.net/ International Osteoporosis Foundation: Investment banker, operational.iofbonehealth.org Summary The aging process leads to an overall loss of bone mass in the body, which can increase the likelihood of broken bones and osteoporosis. Eating a well-balanced diet with plenty of calcium and vitamin D will help to protect your bones. Weight-bearing and strength-building activities are also important for building and maintaining strong bones. Bone mass can be measured with an X-ray test called a bone mineral density (BMD) test. This information is not intended to replace advice given to you by your health care provider. Make sure you discuss any questions you have with your healthcare provider. Document Revised: 12/28/2017 Document Reviewed: 12/28/2017 Elsevier Patient Education  2022 Elsevier Inc. Complicated Grief Grief is a normal response to the death of someone close to you. Feelings of fear, anger, and guilt can affect almost everyone who loses a loved one. It is also common to have symptoms of depression while you are grieving. These include problems with sleep, loss of appetite, and lack of energy. They maylast for weeks or months after a loss. Complicated grief is different from normal grief or depression. Normal grieving involves sadness and feelings of loss, but those feelings get better and heal over time. Complicated grief is a severe type of grief that lasts for a long time, usually for several months to a year or longer. It  interferes with your ability to function normally. Complicated grief may require treatment from [facility] health care provider. What are the causes? The cause of this condition is not known. It is not clear why some peoplecontinue to struggle with grief and others do not. What increases the risk? You are more likely to develop this condition if: The death of your loved one was sudden or unexpected. The death of your loved one was due to a violent event. Your loved one died from suicide. Your loved one was a child or a young person. You were very close to your loved one, or you were dependent on him or her. You have a history of depression or anxiety. What are the signs or symptoms? Symptoms of this condition include: Feeling disbelief or having a lack of emotion (numbness). Being unable to enjoy good memories of your loved one. Needing to avoid anything or anyone that reminds  you of your loved one. Being unable to stop thinking about the death. Feeling intense anger or guilt. Feeling alone and hopeless. Feeling that your life is meaningless and empty. Losing the desire to move on with your life. How is this diagnosed? This condition may be diagnosed based on: Your symptoms. Complicated grief will be diagnosed if you have ongoing symptoms of grief for 6-12 months or longer. The effect of symptoms on your life. You may be diagnosed with this condition if your symptoms are interfering with your ability to live your life. Your health care provider may recommend that you see a mental health care provider. Many symptoms of depression are similar to the symptoms of complicated grief. It is important to be evaluated for complicated grief alongwith other mental health conditions. How is this treated? This condition is most commonly treated with talk therapy. This therapy is offered by a mental health specialist (psychiatrist). During therapy: You will learn healthy ways to cope with the loss of your  loved one. Your mental health care provider may recommend antidepressant medicines. Follow these instructions at home: Lifestyle  Take care of yourself. Eat on a regular basis, and maintain a healthy diet. Eat plenty of fruits, vegetables, lean protein, and whole grains. Try to get some exercise each day. Aim for 30 minutes of exercise on most days of the week. Keep a consistent sleep schedule. Try to get 8 or more hours of sleep each night. Start doing the things that you used to enjoy. Do not use drugs or alcohol to ease your symptoms. Spend time with friends and loved ones.  General instructions Take over-the-counter and prescription medicines only as told by your health care provider. Consider joining a grief (bereavement) support group to help you deal with your loss. Keep all follow-up visits as told by your health care provider. This is important. Contact a health care provider if: Your symptoms prevent you from functioning normally. Your symptoms do not get better with treatment. Get help right away if: You have serious thoughts about hurting yourself or someone else. You have suicidal feelings. If you ever feel like you may hurt yourself or others, or have thoughts about taking your own life, get help right away. You can go to your nearest emergency department or call: Your local emergency services (911 in the [place] A suicide crisis helpline, such as the National Suicide Prevention Lifeline at 847-446-2987. This is open 24 hours a day. Summary Complicated grief is a severe type of grief that lasts for a long time. This grief is not likely to go away on its own. Get the help you need. Some griefs are more difficult than others and can cause this condition. You may need a certain type of treatment to help you recover if the loss of your loved one was sudden, violent, or due to suicide. You may feel guilty about moving on with your life. Getting help does not mean that you are  forgetting your loved one. It means that you are taking care of yourself. Complicated grief is best treated with talk therapy. Medicines may also be prescribed. Seek the help you need, and find support that will help you recover. This information is not intended to replace advice given to you by your health care provider. Make sure you discuss any questions you have with your healthcare provider. Document Revised: 05/24/2020 Document Reviewed: 05/24/2020 Elsevier Patient Education  2022 Elsevier Inc. Managing Loss, Adult People experience loss in many different ways throughout  their lives. Events such as moving, changing jobs, and losing friends can create a sense of loss. The loss may be as serious as a major health change, divorce, death of a pet, or death of a loved one. All of these types of loss are likely to create a physical and emotional reaction known as grief. Grief is the result of a major change or an absence of something or someone that you count on. Grief is anormal reaction to loss. A variety of factors can affect your grieving experience, including: The nature of your loss. Your relationship to what or whom you lost. Your understanding of grief and how to manage it. Your support system. How to manage lifestyle changes Keep to your normal routine as much as possible. If you have trouble focusing or doing normal activities, it is acceptable to take some time away from your normal routine. Spend time with friends and loved ones. Eat a healthy diet, get plenty of sleep, and rest when you feel tired. How to recognize changes  The way that you deal with your grief will affect your ability to function as you normally do. When grieving, you may experience these changes: Numbness, shock, sadness, anxiety, anger, denial, and guilt. Thoughts about death. Unexpected crying. A physical sensation of emptiness in your stomach. Problems sleeping and eating. Tiredness (fatigue). Loss of  interest in normal activities. Dreaming about or imagining seeing the person who died. A need to remember what or whom you lost. Difficulty thinking about anything other than your loss for a period of time. Relief. If you have been expecting the loss for a while, you may feel a sense of relief when it happens. Follow these instructions at home: Activity Express your feelings in healthy ways, such as: Talking with others about your loss. It may be helpful to find others who have had a similar loss, such as a support group. Writing down your feelings in a journal. Doing physical activities to release stress and emotional energy. Doing creative activities like painting, sculpting, or playing or listening to music. Practicing resilience. This is the ability to recover and adjust after facing challenges. Reading some resources that encourage resilience may help you to learn ways to practice those behaviors.  General instructions Be patient with yourself and others. Allow the grieving process to happen, and remember that grieving takes time. It is likely that you may never feel completely done with some grief. You may find a way to move on while still cherishing memories and feelings about your loss. Accepting your loss is a process. It can take months or longer to adjust. Keep all follow-up visits as told by your health care provider. This is important. Where to find support To get support for managing loss: Ask your health care provider for help and recommendations, such as grief counseling or therapy. Think about joining a support group for people who are managing a loss. Where to find more information You can find more information about managing loss from: American Society of Clinical Oncology: www.cancer.net American Psychological Association: [employer] Contact a health care provider if: Your grief is extreme and keeps getting worse. You have ongoing grief that does not improve. Your  body shows symptoms of grief, such as illness. You feel depressed, anxious, or lonely. Get help right away if: You have thoughts about hurting yourself or others. If you ever feel like you may hurt yourself or others, or have thoughts about taking your own life, get help right away. You  can go to your nearest emergency department or call: Your local emergency services (911 in the [place] A suicide crisis helpline, such as the National Suicide Prevention Lifeline at 475-316-9779. This is open 24 hours a day. Summary Grief is the result of a major change or an absence of someone or something that you count on. Grief is a normal reaction to loss. The depth of grief and the period of recovery depend on the type of loss and your ability to adjust to the change and process your feelings. Processing grief requires patience and a willingness to accept your feelings and talk about your loss with people who are supportive. It is important to find resources that work for you and to realize that people experience grief differently. There is not one grieving process that works for everyone in the same way. Be aware that when grief becomes extreme, it can lead to more severe issues like isolation, depression, anxiety, or suicidal thoughts. Talk with your health care provider if you have any of these issues. This information is not intended to replace advice given to you by your health care provider. Make sure you discuss any questions you have with your healthcare provider. Document Revised: 05/24/2020 Document Reviewed: 05/24/2020 Elsevier Patient Education  2022 [employer].

## 2021-08-09 NOTE — Progress Notes (Signed)
Gynecology Annual Exam  PCP: Schermerhorn, Ihor Austin, MD  Chief Complaint:  Chief Complaint  Patient presents with   Gynecologic Exam    No concerns    History of Present Illness: Patient is a 50 y.o. Molly [name] presents for annual exam. The patient has no complaints today.   LMP: Patient's last menstrual period was 07/19/2021 (exact date). Average Interval: regular, monthly Duration of flow: 4 days  Heavy Menses: no Dysmenorrhea: no  She denies passage of large clots She denies sensations of gushing or flooding of blood. She denies accidents where she bleeds through her clothing. She denies that she changes a saturated pad or tampon more frequently than every hour.  She denies that pain from her periods limits her activities.  The patient does perform self breast exams.  There is no notable family history of breast or ovarian cancer in her family.  The patient has regular exercise: not currently, has a gym membership and considering starting again.   The patient denies current symptoms of depression.   PHQ-9: 0 GAD-7: 0   Review of Systems: Review of Systems  Constitutional:  Negative for chills, fever, malaise/fatigue and weight loss.  HENT:  Negative for congestion, hearing loss and sinus pain.   Eyes:  Negative for blurred vision and double vision.  Respiratory:  Negative for cough, sputum production, shortness of breath and wheezing.   Cardiovascular:  Negative for chest pain, palpitations, orthopnea and leg swelling.  Gastrointestinal:  Negative for abdominal pain, constipation, diarrhea, nausea and vomiting.  Genitourinary:  Negative for dysuria, flank pain, frequency, hematuria and urgency.  Musculoskeletal:  Negative for back pain, falls and joint pain.  Skin:  Negative for itching and rash.  Neurological:  Negative for dizziness and headaches.  Psychiatric/Behavioral:  Negative for depression, substance abuse and suicidal ideas. The patient is not  nervous/anxious.    Past Medical History:  Past Medical History:  Diagnosis Date   No pertinent past medical history     Past Surgical History:  Past Surgical History:  Procedure Laterality Date   NO PAST SURGERIES      Gynecologic History:  Patient's last menstrual period was 07/19/2021 (exact date). Menarche: 12  History of fibroids, polyps, or ovarian cysts? : no  History of PCOS? no Hstory of Endometriosis? no History of abnormal pap smears? no Have you had any sexually transmitted infections in the past?  no Pap in 2022 NIL    She is sexually active with men.   She denies dyspareunia. She denies postcoital bleeding.    Obstetric History: [id number]  Family History:  Family History  Problem Relation Age of Onset   Lung cancer Father    Breast cancer Paternal Aunt        40s/50s    Social History:  Social History   Socioeconomic History   Marital status: Married    Spouse name: Not on file   Number of children: Not on file   Years of education: Not on file   Highest education level: Not on file  Occupational History   Not on file  Tobacco Use   Smoking status: Every Day   Smokeless tobacco: Never  Vaping Use   Vaping Use: Never used  Substance and Sexual Activity   Alcohol use: Yes    Comment: occ   Drug use: Never   Sexual activity: Yes    Birth control/protection: Pill  Other Topics Concern   Not on file  Social History Narrative  Not on file   Social Determinants of Health   Financial Resource Strain: Not on file  Food Insecurity: Not on file  Transportation Needs: Not on file  Physical Activity: Not on file  Stress: Not on file  Social Connections: Not on file  Intimate Partner Violence: Not on file    Allergies:  No Known Allergies  Medications: Prior to Admission medications   Medication Sig Start Date End Date Taking? Authorizing Provider  albuterol (VENTOLIN HFA) 108 (90 Base) MCG/ACT inhaler Inhale into the lungs. 07/23/16   Yes [provider]  CRYSELLE-28 0.3-30 MG-MCG tablet Take 1 tablet by mouth daily. 08/07/20  Yes Carron Mcmurry, Jaquelyn Bitter, MD    Physical Exam Vitals: Blood pressure 114/60, height 5\' 4"  (1.626 m), weight 143 lb (64.9 kg), last menstrual period 07/19/2021.  Physical Exam Constitutional:      Appearance: Normal appearance. She is well-developed.  Genitourinary:     Genitourinary Comments: External: Normal appearing vulva. No lesions noted.  Speculum examination: Normal appearing cervix. No blood in the vaginal vault. No discharge.   Bimanual examination: Uterus midline, non-tender, normal in size, shape and contour.  No CMT. No adnexal masses. No adnexal tenderness. Pelvis not fixed.  Breast Exam: breast equal without skin changes, nipple discharge, breast lump or enlarged lymph nodes   HENT:     Head: Normocephalic and atraumatic.  Eyes:     Extraocular Movements: Extraocular movements intact.     Pupils: Pupils are equal, round, and reactive to light.  Neck:     Thyroid: No thyromegaly.  Cardiovascular:     Rate and Rhythm: Normal rate and regular rhythm.     Heart sounds: Normal heart sounds.  Pulmonary:     Effort: Pulmonary effort is normal.     Breath sounds: Normal breath sounds.  Abdominal:     General: Bowel sounds are normal. There is no distension.     Palpations: Abdomen is soft. There is no mass.  Musculoskeletal:     Cervical back: Neck supple.  Neurological:     Mental Status: She is alert and oriented to person, place, and time.  Skin:    General: Skin is warm and dry.  Psychiatric:        Behavior: Behavior normal.        Thought Content: Thought content normal.        Judgment: Judgment normal.  Vitals reviewed.     Female chaperone present for pelvic and breast  portions of the physical exam  Assessment: 50 y.o. 44 routine annual exam  Plan: Problem List Items Addressed This Visit   None Visit Diagnoses     Encounter for annual  routine gynecological examination    -  Primary   Health maintenance examination       Breast cancer screening by mammogram       Relevant Orders   MM 3D SCREEN BREAST BILATERAL   Encounter for gynecological examination without abnormal finding       Encounter for birth control pills maintenance       History of tobacco use       Relevant Orders   CT CHEST LUNG CANCER SCREENING LOW DOSE WO CONTRAST   Ambulatory Referral for Lung Cancer Scre   Colon cancer screening       Relevant Orders   Ambulatory referral to Gastroenterology   Encounter for initial prescription of contraceptive pills       Relevant Medications   CRYSELLE-28 0.3-30 MG-MCG tablet  1) Mammogram - recommend yearly screening mammogram.  Mammogram was ordered today  2) STI screening was offered and declined.  3) ASCCP guidelines and rational discussed.  Pap smear is normal  4) Contraception - She strongly desires to continue her OCP.   5) Colonoscopy -- Referral placed.   6) Routine healthcare maintenance including cholesterol, diabetes screening discussed - last ordered in 2020. Plan to repeat in 2022.   7) Lung cancer screening annually. Ordered.   Adelene Idler MD, [facility], [facility] 08/09/2021 4:43 PM

## 2021-08-13 DIAGNOSIS — G43909 Migraine, unspecified, not intractable, without status migrainosus: Secondary | ICD-10-CM | POA: Insufficient documentation

## 2021-08-14 ENCOUNTER — Other Ambulatory Visit: Payer: Self-pay

## 2021-08-14 ENCOUNTER — Telehealth: Payer: Self-pay

## 2021-08-14 DIAGNOSIS — Z1211 Encounter for screening for malignant neoplasm of colon: Secondary | ICD-10-CM

## 2021-08-14 MED ORDER — NA SULFATE-K SULFATE-MG SULF 17.5-3.13-1.6 GM/177ML PO SOLN: 354.0000 mL | Freq: Once | ORAL | 0 refills | Status: AC

## 2021-08-14 NOTE — Telephone Encounter (Signed)
Gastroenterology Pre-Procedure Review  Request Date: 09/13/2021 Requesting Physician: Dr. Maximino Greenland   PATIENT REVIEW QUESTIONS: The patient responded to the following health history questions as indicated:    1. Are you having any GI issues? no 2. Do you have a personal history of Polyps? no 3. Do you have a family history of Colon Cancer or Polyps? no 4. Diabetes Mellitus? no 5. Joint replacements in the past 12 months?no 6. Major health problems in the past 3 months?no 7. Any artificial heart valves, MVP, or defibrillator?no    MEDICATIONS & ALLERGIES:    Patient reports the following regarding taking any anticoagulation/antiplatelet therapy:   Plavix, Coumadin, Eliquis, Xarelto, Lovenox, Pradaxa, Brilinta, or Effient? no Aspirin? no  Patient confirms/reports the following medications:  Current Outpatient Medications  Medication Sig Dispense Refill   albuterol (VENTOLIN HFA) 108 (90 Base) MCG/ACT inhaler Inhale into the lungs.     CRYSELLE-28 0.3-30 MG-MCG tablet Take 1 tablet by mouth daily. 28 tablet 11   No current facility-administered medications for this visit.    Patient confirms/reports the following allergies:  No Known Allergies  No orders of the defined types were placed in this encounter.   AUTHORIZATION INFORMATION Primary Insurance: 1D#: Group #:  Secondary Insurance: 1D#: Group #:  SCHEDULE INFORMATION: Date:  Time: Location:

## 2021-09-10 ENCOUNTER — Other Ambulatory Visit: Payer: Self-pay | Admitting: *Deleted

## 2021-09-10 DIAGNOSIS — Z87891 Personal history of nicotine dependence: Secondary | ICD-10-CM

## 2021-09-13 ENCOUNTER — Ambulatory Visit: Payer: BC Managed Care – PPO | Admitting: Anesthesiology

## 2021-09-13 ENCOUNTER — Other Ambulatory Visit: Payer: Self-pay

## 2021-09-13 ENCOUNTER — Ambulatory Visit
Admission: RE | Admit: 2021-09-13 | Discharge: 2021-09-13 | Disposition: A | Discharge status: Home / Self Care | Payer: BC Managed Care – PPO | Attending: Gastroenterology | Admitting: Gastroenterology

## 2021-09-13 ENCOUNTER — Encounter: Payer: Self-pay | Admitting: Gastroenterology

## 2021-09-13 ENCOUNTER — Encounter
Admission: RE | Disposition: A | Discharge status: Home / Self Care | Payer: Self-pay | Source: Home / Self Care | Attending: Gastroenterology

## 2021-09-13 DIAGNOSIS — Z1211 Encounter for screening for malignant neoplasm of colon: Secondary | ICD-10-CM | POA: Diagnosis not present

## 2021-09-13 DIAGNOSIS — F17210 Nicotine dependence, cigarettes, uncomplicated: Secondary | ICD-10-CM | POA: Diagnosis not present

## 2021-09-13 DIAGNOSIS — G43909 Migraine, unspecified, not intractable, without status migrainosus: Secondary | ICD-10-CM | POA: Diagnosis not present

## 2021-09-13 HISTORY — PX: COLONOSCOPY WITH PROPOFOL: SHX5780

## 2021-09-13 LAB — POCT PREGNANCY, URINE: Preg Test, Ur: NEGATIVE

## 2021-09-13 SURGERY — COLONOSCOPY WITH PROPOFOL
Anesthesia: General

## 2021-09-13 MED ORDER — SODIUM CHLORIDE 0.9 % IV SOLN: INTRAVENOUS | Status: DC

## 2021-09-13 MED ORDER — PROPOFOL 500 MG/50ML IV EMUL
INTRAVENOUS | Status: AC
  Filled 2021-09-13: qty 50

## 2021-09-13 MED ORDER — DEXMEDETOMIDINE (PRECEDEX) IN NS 20 MCG/5ML (4 MCG/ML) IV SYRINGE
PREFILLED_SYRINGE | INTRAVENOUS | Status: DC | PRN
  Administered 2021-09-13: 12 ug via INTRAVENOUS

## 2021-09-13 MED ORDER — PROPOFOL 10 MG/ML IV BOLUS
INTRAVENOUS | Status: DC | PRN
  Administered 2021-09-13: 20 mg via INTRAVENOUS
  Administered 2021-09-13: 80 mg via INTRAVENOUS
  Administered 2021-09-13: 20 mg via INTRAVENOUS

## 2021-09-13 MED ORDER — PROPOFOL 500 MG/50ML IV EMUL
INTRAVENOUS | Status: DC | PRN
  Administered 2021-09-13: 150 ug/kg/min via INTRAVENOUS

## 2021-09-13 MED ORDER — LIDOCAINE HCL (CARDIAC) PF 100 MG/5ML IV SOSY
PREFILLED_SYRINGE | INTRAVENOUS | Status: DC | PRN
  Administered 2021-09-13: 40 mg via INTRAVENOUS

## 2021-09-13 NOTE — Transfer of Care (Signed)
Immediate Anesthesia Transfer of Care Note  Patient: Molly [name]  Procedure(s) Performed: Procedure(s): COLONOSCOPY WITH PROPOFOL (N/A)  Patient Location: PACU and Endoscopy Unit  Anesthesia Type:General  Level of Consciousness: sedated  Airway & Oxygen Therapy: Patient Spontanous Breathing and Patient connected to nasal cannula oxygen  Post-op Assessment: Report given to RN and Post -op Vital signs reviewed and stable  Post vital signs: Reviewed and stable  Last Vitals:  Vitals:   09/13/21 1102 09/13/21 1241  BP: 140/76 107/68  Pulse: 66 64  Resp: 18 20  Temp: (!) 36.4 C (!) 36.2 C  SpO2: 100% 99%    Complications: No apparent anesthesia complications

## 2021-09-13 NOTE — Anesthesia Procedure Notes (Signed)
Date/Time: 09/13/2021 12:07 PM Performed by: Stormy Fabian, CRNA Pre-anesthesia Checklist: Patient identified, Emergency Drugs available, Suction available and Patient being monitored Patient Re-evaluated:Patient Re-evaluated prior to induction Oxygen Delivery Method: Nasal cannula Induction Type: IV induction Dental Injury: Teeth and Oropharynx as per pre-operative assessment  Comments: Nasal cannula with etCO2 monitoring

## 2021-09-13 NOTE — Op Note (Signed)
[facility] Gastroenterology Patient Name: Molly [name] Procedure Date: 09/13/2021 12:00 PM MRN: [id number] Account #: [id number] Date of Birth: [birth year] Admit Type: Outpatient Age: 50 Room: [facility] ENDO ROOM 2 Gender: Female Note Status: Finalized Instrument Name: Prentice Docker [id number] Procedure:             Colonoscopy Indications:           Screening for colorectal malignant neoplasm Providers:             Kiernan Atkerson B. Maximino Greenland MD, MD Referring MD:          Suzy Bouchard, MD (Referring MD) Medicines:             Monitored Anesthesia Care Complications:         No immediate complications. Procedure:             Pre-Anesthesia Assessment:                        - Prior to the procedure, a History and Physical was                         performed, and patient medications, allergies and                         sensitivities were reviewed. The patient's tolerance                         of previous anesthesia was reviewed.                        - The risks and benefits of the procedure and the                         sedation options and risks were discussed with the                         patient. All questions were answered and informed                         consent was obtained.                        - Patient identification and proposed procedure were                         verified prior to the procedure by the physician, the                         nurse, the anesthetist and the technician. The                         procedure was verified in the pre-procedure area in                         the procedure room in the endoscopy suite.                        - ASA Grade Assessment: II - A patient with mild  systemic disease.                        - After reviewing the risks and benefits, the patient                         was deemed in satisfactory condition to undergo the                         procedure.                         After obtaining informed consent, the colonoscope was                         passed under direct vision. Throughout the procedure,                         the patient's blood pressure, pulse, and oxygen                         saturations were monitored continuously. The                         Colonoscope was introduced through the anus and                         advanced to the the cecum, identified by appendiceal                         orifice and ileocecal valve. The colonoscopy was                         performed with ease. The patient tolerated the                         procedure well. The quality of the bowel preparation                         was good. Findings:      The perianal and digital rectal examinations were normal.      The rectum, sigmoid colon, descending colon, transverse colon, ascending       colon and cecum appeared normal.      The retroflexed view of the distal rectum and anal verge was normal and       showed no anal or rectal abnormalities. Impression:            - The rectum, sigmoid colon, descending colon,                         transverse colon, ascending colon and cecum are normal.                        - The distal rectum and anal verge are normal on                         retroflexion view.                        - No  specimens collected. Recommendation:        - Discharge patient to home.                        - Resume previous diet.                        - Continue present medications.                        - Repeat colonoscopy in 10 years for screening                         purposes.                        - Return to primary care physician as previously                         scheduled.                        - The findings and recommendations were discussed with                         the patient.                        - The findings and recommendations were discussed with                         the patient's family.                         - In the future, if patient develops new symptoms such                         as blood per rectum, abdominal pain, weight loss,                         altered bowel habits or any other reason for concern,                         patient should discuss this with thier PCP as they may                         need a GI referral at that time or evaluation for need                         for colonoscopy earlier than the recommended screening                         colonoscopy.                        In addition, if patient's family history of colon                         cancer changes (no family history at this time) in the  future, earlier screening may be indicated and patient                         should discuss this with PCP as well. Procedure Code(s):     --- Professional ---                        [phone], Colonoscopy, flexible; diagnostic, including                         collection of specimen(s) by brushing or washing, when                         performed (separate procedure) Diagnosis Code(s):     --- Professional ---                        Z12.11, Encounter for screening for malignant neoplasm                         of colon CPT copyright 2019 American Medical Association. All rights reserved. The codes documented in this report are preliminary and upon coder review may  be revised to meet current compliance requirements.  [name], MD Michel Bickers B. Maximino Greenland MD, MD 09/13/2021 12:39:47 PM This report has been signed electronically. Number of Addenda: 0 Note Initiated On: 09/13/2021 12:00 PM Scope Withdrawal Time: 0 hours 15 minutes 22 seconds  Total Procedure Duration: 0 hours 24 minutes 38 seconds       [facility]

## 2021-09-13 NOTE — H&P (Signed)
Molly Bouillon, MD [address], [address], [place], [place], [address] [address], [place], [place], [address] Phone: [phone]  Fax: [phone]  Primary Care Physician:  Schermerhorn, Ihor Austin, MD   Pre-Procedure History & Physical: HPI:  Molly [name] is a 50 y.o. female is here for a colonoscopy.   Past Medical History:  Diagnosis Date   No pertinent past medical history     Past Surgical History:  Procedure Laterality Date   NO PAST SURGERIES      Prior to Admission medications   Medication Sig Start Date End Date Taking? Authorizing Provider  CRYSELLE-28 0.3-30 MG-MCG tablet Take 1 tablet by mouth daily. 08/09/21  Yes Schuman, Christanna R, MD  albuterol (VENTOLIN HFA) 108 (90 Base) MCG/ACT inhaler Inhale into the lungs. 07/23/16   [provider]    Allergies as of 08/14/2021   (No Known Allergies)    Family History  Problem Relation Age of Onset   Lung cancer Father    Breast cancer Paternal Aunt        40s/50s    Social History   Socioeconomic History   Marital status: Married    Spouse name: Not on file   Number of children: Not on file   Years of education: Not on file   Highest education level: Not on file  Occupational History   Not on file  Tobacco Use   Smoking status: Some Days    Types: Cigarettes   Smokeless tobacco: Never  Vaping Use   Vaping Use: Never used  Substance and Sexual Activity   Alcohol use: Yes    Comment: occ   Drug use: Never   Sexual activity: Yes    Birth control/protection: Pill  Other Topics Concern   Not on file  Social History Narrative   Not on file   Social Determinants of Health   Financial Resource Strain: Not on file  Food Insecurity: Not on file  Transportation Needs: Not on file  Physical Activity: Not on file  Stress: Not on file  Social Connections: Not on file  Intimate Partner Violence: Not on file    Review of Systems: See HPI, otherwise negative  ROS  Physical Exam: Constitutional: General:   Alert,  Well-developed, well-nourished, pleasant and cooperative in NAD BP 140/76   Pulse 66   Temp (!) 97.5 F (36.4 C) (Temporal)   Resp 18   Ht 5\' 4"  (1.626 m)   Wt 64.9 kg   LMP  (LMP Unknown) Comment: neg preg 09/13/21  SpO2 100%   BMI 24.55 kg/m   Head: Normocephalic, atraumatic.   Eyes:  Sclera clear, no icterus.   Conjunctiva pink.   Mouth:  No deformity or lesions, oropharynx pink & moist.  Neck:  Supple, trachea midline  Respiratory: Normal respiratory effort  Gastrointestinal:  Soft, non-tender and non-distended without masses, hepatosplenomegaly or hernias noted.  No guarding or rebound tenderness.     Cardiac: No clubbing or edema.  No cyanosis. Normal posterior tibial pedal pulses noted.  Lymphatic:  No significant cervical adenopathy.  Psych:  Alert and cooperative. Normal mood and affect.  Musculoskeletal:   Symmetrical without gross deformities. 5/5 Lower extremity strength bilaterally.  Skin: Warm. Intact without significant lesions or rashes. No jaundice.  Neurologic:  Face symmetrical, tongue midline, Normal sensation to touch;  grossly normal neurologically.  Psych:  Alert and oriented x3, Alert and cooperative. Normal mood and affect.  Impression/Plan: Molly [name] is here for a  colonoscopy to be performed for average risk screening.  Risks, benefits, limitations, and alternatives regarding  colonoscopy have been reviewed with the patient.  Questions have been answered.  All parties agreeable.   Pasty Spillers, MD  09/13/2021, 11:24 AM

## 2021-09-13 NOTE — Anesthesia Postprocedure Evaluation (Signed)
Anesthesia Post Note  Patient: Molly [name]  Procedure(s) Performed: COLONOSCOPY WITH PROPOFOL  Patient location during evaluation: Endoscopy Anesthesia Type: General Level of consciousness: awake and alert Pain management: pain level controlled Vital Signs Assessment: post-procedure vital signs reviewed and stable Respiratory status: spontaneous breathing, nonlabored ventilation and respiratory function stable Cardiovascular status: blood pressure returned to baseline and stable Postop Assessment: no apparent nausea or vomiting Anesthetic complications: no   No notable events documented.   Last Vitals:  Vitals:   09/13/21 1251 09/13/21 1301  BP: 120/77 133/76  Pulse: 60 61  Resp: 16 15  Temp:    SpO2: 100% 99%    Last Pain:  Vitals:   09/13/21 1301  TempSrc:   PainSc: 0-No pain                 Foye Deer

## 2021-09-13 NOTE — Anesthesia Preprocedure Evaluation (Addendum)
Anesthesia Evaluation  Patient identified by MRN, date of birth, ID band Patient awake    Reviewed: Allergy & Precautions, NPO status , Patient's Chart, lab work & pertinent test results  Airway Mallampati: I  TM Distance: >3 FB     Dental no notable dental hx.    Pulmonary Current SmokerPatient did not abstain from smoking.,    Pulmonary exam normal        Cardiovascular Exercise Tolerance: Good negative cardio ROS Normal cardiovascular exam     Neuro/Psych negative neurological ROS  negative psych ROS   GI/Hepatic negative GI ROS, Neg liver ROS,   Endo/Other  negative endocrine ROS  Renal/GU negative Renal ROS  negative genitourinary   Musculoskeletal   Abdominal Normal abdominal exam  (+)   Peds  Hematology negative hematology ROS (+)   Anesthesia Other Findings Past Medical History: No date: No pertinent past medical history  Past Surgical History: No date: NO PAST SURGERIES     Reproductive/Obstetrics negative OB ROS                            Anesthesia Physical Anesthesia Plan  ASA: 3  Anesthesia Plan: General   Post-op Pain Management:    Induction:   PONV Risk Score and Plan: 3 and Propofol infusion, TIVA and Treatment may vary due to age or medical condition  Airway Management Planned: Nasal Cannula and Natural Airway  Additional Equipment:   Intra-op Plan:   Post-operative Plan:   Informed Consent: I have reviewed the patients History and Physical, chart, labs and discussed the procedure including the risks, benefits and alternatives for the proposed anesthesia with the patient or authorized representative who has indicated his/her understanding and acceptance.     Dental advisory given  Plan Discussed with: Anesthesiologist, CRNA and Surgeon  Anesthesia Plan Comments:        Anesthesia Quick Evaluation

## 2021-09-16 ENCOUNTER — Encounter: Payer: Self-pay | Admitting: Gastroenterology

## 2021-09-30 ENCOUNTER — Other Ambulatory Visit: Payer: Self-pay

## 2021-09-30 ENCOUNTER — Ambulatory Visit
Admission: RE | Admit: 2021-09-30 | Discharge: 2021-09-30 | Disposition: A | Discharge status: Home / Self Care | Payer: BC Managed Care – PPO | Source: Ambulatory Visit | Attending: Obstetrics and Gynecology | Admitting: Obstetrics and Gynecology

## 2021-09-30 DIAGNOSIS — Z1231 Encounter for screening mammogram for malignant neoplasm of breast: Secondary | ICD-10-CM

## 2021-10-30 ENCOUNTER — Encounter: Payer: Self-pay | Admitting: Obstetrics and Gynecology

## 2021-10-30 ENCOUNTER — Other Ambulatory Visit: Payer: Self-pay

## 2021-10-30 ENCOUNTER — Ambulatory Visit (INDEPENDENT_AMBULATORY_CARE_PROVIDER_SITE_OTHER): Payer: BC Managed Care – PPO | Admitting: Obstetrics and Gynecology

## 2021-10-30 VITALS — BP 122/76 | Ht 65.0 in | Wt 151.0 lb

## 2021-10-30 DIAGNOSIS — N3001 Acute cystitis with hematuria: Secondary | ICD-10-CM

## 2021-10-30 LAB — POCT URINALYSIS DIPSTICK
Bilirubin, UA: NEGATIVE
Glucose, UA: NEGATIVE
Ketones, UA: NEGATIVE
Leukocytes, UA: NEGATIVE
Nitrite, UA: NEGATIVE
Protein, UA: NEGATIVE
Spec Grav, UA: 1.015 (ref 1.010–1.025)
pH, UA: 6.5 (ref 5.0–8.0)

## 2021-10-30 MED ORDER — NITROFURANTOIN MONOHYD MACRO 100 MG PO CAPS: 100.0000 mg | ORAL_CAPSULE | Freq: Two times a day (BID) | ORAL | 0 refills | Status: AC

## 2021-10-30 NOTE — Progress Notes (Signed)
[name], Molly Austin, MD   Chief Complaint  Patient presents with   Urinary Tract Infection    Frequency, pressure with urination  x 4 days. Had old antibiotics from 1 yr ago that she has been taking. RM 13    HPI:      [name] is a 50 y.o. G2P2002 whose LMP was No LMP recorded. (Menstrual status: Irregular Periods)., presents today for UTI sx of frequency/urgency/pelvic pressure, vaginal pain, mild LBP for past 4 days. No hematuria, fevers, vag sx, vag bleeding. Tried AZO without relief, has taken 2-3 leftover macrobid recently. Hx of a few UTIs in past.   Patient Active Problem List   Diagnosis Date Noted   Screening for colon cancer    Migraines 08/13/2021   Poison ivy 06/14/2015    Past Surgical History:  Procedure Laterality Date   COLONOSCOPY WITH PROPOFOL N/A 09/13/2021   Procedure: COLONOSCOPY WITH PROPOFOL;  Surgeon: Pasty Spillers, MD;  Location: [facility] ENDOSCOPY;  Service: Endoscopy;  Laterality: N/A;   NO PAST SURGERIES      Family History  Problem Relation Age of Onset   Lung cancer Father    Breast cancer Paternal Aunt        14s/50s    Social History   Socioeconomic History   Marital status: Married    Spouse name: Not on file   Number of children: Not on file   Years of education: Not on file   Highest education level: Not on file  Occupational History   Not on file  Tobacco Use   Smoking status: Some Days    Types: Cigarettes   Smokeless tobacco: Never  Vaping Use   Vaping Use: Never used  Substance and Sexual Activity   Alcohol use: Yes    Comment: occ   Drug use: Never   Sexual activity: Yes    Birth control/protection: Pill  Other Topics Concern   Not on file  Social History Narrative   Not on file   Social Determinants of Health   Financial Resource Strain: Not on file  Food Insecurity: Not on file  Transportation Needs: Not on file  Physical Activity: Not on file  Stress: Not on file  Social Connections:  Not on file  Intimate Partner Violence: Not on file    Outpatient Medications Prior to Visit  Medication Sig Dispense Refill   CRYSELLE-28 0.3-30 MG-MCG tablet Take 1 tablet by mouth daily. 28 tablet 11   albuterol (VENTOLIN HFA) 108 (90 Base) MCG/ACT inhaler Inhale into the lungs.     No facility-administered medications prior to visit.      ROS:  Review of Systems  Constitutional:  Negative for fever.  Gastrointestinal:  Negative for blood in stool, constipation, diarrhea, nausea and vomiting.  Genitourinary:  Positive for dysuria, frequency, pelvic pain and urgency. Negative for dyspareunia, flank pain, hematuria, vaginal bleeding, vaginal discharge and vaginal pain.  Musculoskeletal:  Positive for back pain.  Skin:  Negative for rash.  BREAST: No symptoms   OBJECTIVE:   Vitals:  BP 122/76   Ht 5\' 5"  (1.651 m)   Wt 151 lb (68.5 kg)   BMI 25.13 kg/m   Physical Exam Vitals reviewed.  Constitutional:      Appearance: She is well-developed. She is not ill-appearing or toxic-appearing.  Neck:     Thyroid: No thyromegaly.     Trachea: No tracheal deviation.  Cardiovascular:     Rate and Rhythm: Normal rate and  regular rhythm.  Pulmonary:     Effort: Pulmonary effort is normal.     Breath sounds: Normal breath sounds.  Abdominal:     Palpations: Abdomen is soft.     Tenderness: There is no right CVA tenderness or left CVA tenderness.  Musculoskeletal:        General: Normal range of motion.     Cervical back: Normal range of motion.  Neurological:     General: No focal deficit present.     Mental Status: She is alert and oriented to person, place, and time.     Cranial Nerves: No cranial nerve deficit.  Psychiatric:        Behavior: Behavior normal.        Thought Content: Thought content normal.        Judgment: Judgment normal.    Results: Results for orders placed or performed in visit on 10/30/21 (from the past 24 hour(s))  POCT Urinalysis Dipstick      Status: Abnormal   Collection Time: 10/30/21  2:25 PM  Result Value Ref Range   Color, UA yellow    Clarity, UA cloudy    Glucose, UA Negative Negative   Bilirubin, UA neg    Ketones, UA neg    Spec Grav, UA 1.015 1.010 - 1.025   Blood, UA small    pH, UA 6.5 5.0 - 8.0   Protein, UA Negative Negative   Urobilinogen, UA     Nitrite, UA neg    Leukocytes, UA Negative Negative   Appearance     Odor       Assessment/Plan: Acute cystitis with hematuria - Plan: nitrofurantoin, macrocrystal-monohydrate, (MACROBID) 100 MG capsule, POCT Urinalysis Dipstick, Urine Culture; pos sx and UA. Check C&S. Pt taken leftover macrobid. Rx macrobid RF. Increase water, f/u prn.    Meds ordered this encounter  Medications   nitrofurantoin, macrocrystal-monohydrate, (MACROBID) 100 MG capsule    Sig: Take 1 capsule (100 mg total) by mouth 2 (two) times daily for 5 days.    Dispense:  10 capsule    Refill:  0    Order Specific Question:   Supervising Provider    Answer:   Gae Dry [id number]      Return if symptoms worsen or fail to improve.  Molly [name] B. Daryana Whirley, PA-C 10/30/2021 2:27 PM

## 2021-11-05 LAB — URINE CULTURE

## 2021-11-06 MED ORDER — CIPROFLOXACIN HCL 500 MG PO TABS: 500.0000 mg | ORAL_TABLET | Freq: Two times a day (BID) | ORAL | 0 refills | Status: AC

## 2021-11-06 NOTE — Addendum Note (Signed)
Addended by: Althea Grimmer B on: 11/06/2021 05:11 AM   Modules accepted: Orders

## 2021-11-06 NOTE — Progress Notes (Signed)
Pt aware.

## 2021-11-06 NOTE — Progress Notes (Signed)
Called pt, no answer, LVMTRC. 

## 2021-11-06 NOTE — Progress Notes (Signed)
Pls notify pt that C&S showed UTI not responsive to macrobid. New cipro Rx eRxd. Thx

## 2021-11-15 ENCOUNTER — Ambulatory Visit
Admission: RE | Admit: 2021-11-15 | Discharge: 2021-11-15 | Disposition: A | Discharge status: Home / Self Care | Payer: BC Managed Care – PPO | Source: Ambulatory Visit | Attending: Acute Care | Admitting: Acute Care

## 2021-11-15 ENCOUNTER — Other Ambulatory Visit: Payer: Self-pay

## 2021-11-15 DIAGNOSIS — Z87891 Personal history of nicotine dependence: Secondary | ICD-10-CM | POA: Insufficient documentation

## 2021-11-21 ENCOUNTER — Telehealth: Payer: BC Managed Care – PPO | Admitting: Physician Assistant

## 2021-11-21 ENCOUNTER — Other Ambulatory Visit: Payer: Self-pay | Admitting: Acute Care

## 2021-11-21 DIAGNOSIS — J069 Acute upper respiratory infection, unspecified: Secondary | ICD-10-CM

## 2021-11-21 DIAGNOSIS — Z87891 Personal history of nicotine dependence: Secondary | ICD-10-CM

## 2021-11-21 DIAGNOSIS — H6983 Other specified disorders of Eustachian tube, bilateral: Secondary | ICD-10-CM

## 2021-11-21 MED ORDER — PROMETHAZINE-DM 6.25-15 MG/5ML PO SYRP: 5.0000 mL | ORAL_SOLUTION | Freq: Four times a day (QID) | ORAL | 0 refills | Status: DC | PRN

## 2021-11-21 MED ORDER — ALBUTEROL SULFATE HFA 108 (90 BASE) MCG/ACT IN AERS: 2.0000 | INHALATION_SPRAY | Freq: Four times a day (QID) | RESPIRATORY_TRACT | 0 refills | Status: DC | PRN

## 2021-11-21 MED ORDER — FLUTICASONE PROPIONATE 50 MCG/ACT NA SUSP: 2.0000 | Freq: Every day | NASAL | 0 refills | Status: DC

## 2021-11-21 NOTE — Progress Notes (Signed)
Virtual Visit Consent   Molly [name], you are scheduled for a virtual visit with a [facility] provider today.     Just as with appointments in the office, your consent must be obtained to participate.  Your consent will be active for this visit and any virtual visit you may have with one of our providers in the next 365 days.     If you have a MyChart account, a copy of this consent can be sent to you electronically.  All virtual visits are billed to your insurance company just like a traditional visit in the office.    As this is a virtual visit, video technology does not allow for your provider to perform a traditional examination.  This may limit your provider's ability to fully assess your condition.  If your provider identifies any concerns that need to be evaluated in person or the need to arrange testing (such as labs, EKG, etc.), we will make arrangements to do so.     Although advances in technology are sophisticated, we cannot ensure that it will always work on either your end or our end.  If the connection with a video visit is poor, the visit may have to be switched to a telephone visit.  With either a video or telephone visit, we are not always able to ensure that we have a secure connection.     I need to obtain your verbal consent now.   Are you willing to proceed with your visit today?    Molly [name] has provided verbal consent on 11/21/2021 for a virtual visit (video or telephone).   Piedad Climes, [place]   Date: 11/21/2021 10:55 AM   Virtual Visit via Video Note   I, Piedad Climes, connected with  Molly [name]  ([id number], [birth year]) on 11/21/21 at 10:45 AM EST by a video-enabled telemedicine application and verified that I am speaking with the correct person using two identifiers.  Location: Patient: Virtual Visit Location Patient: Home Provider: Virtual Visit Location Provider: Home Office   I discussed the limitations of evaluation and management  by telemedicine and the availability of in person appointments. The patient expressed understanding and agreed to proceed.    History of Present Illness: Molly [name] is a 50 y.o. who identifies as a female who was assigned female at birth, and is being seen today for URI symptoms starting. Patient notes cough starting on Tuesday that has sometimes been dry and other times more of a wet cough with phlegm. Now with sinus pressure and discomfort. Denies fever, chills. Notes some fatigue. Denies recent travel or known sick contact. Some windedness during coughing spells with some chest wall tenderness. Is a smoker with known emphysema.   HPI: HPI  Problems:  Patient Active Problem List   Diagnosis Date Noted   Screening for colon cancer    Migraines 08/13/2021   Poison ivy 06/14/2015    Allergies: No Known Allergies Medications:  Current Outpatient Medications:    albuterol (VENTOLIN HFA) 108 (90 Base) MCG/ACT inhaler, Inhale 2 puffs into the lungs every 6 (six) hours as needed for wheezing or shortness of breath., Disp: 8 g, Rfl: 0   fluticasone (FLONASE) 50 MCG/ACT nasal spray, Place 2 sprays into both nostrils daily., Disp: 16 g, Rfl: 0   promethazine-dextromethorphan (PROMETHAZINE-DM) 6.25-15 MG/5ML syrup, Take 5 mLs by mouth 4 (four) times daily as needed for cough., Disp: 118 mL, Rfl: 0   CRYSELLE-28 0.3-30 MG-MCG tablet, Take  1 tablet by mouth daily., Disp: 28 tablet, Rfl: 11  Observations/Objective: Patient is well-developed, well-nourished in no acute distress.  Resting comfortably at home.  Head is normocephalic, atraumatic.  No labored breathing. Speech is clear and coherent with logical content.  Patient is alert and oriented at baseline.   Assessment and Plan: 1. Viral URI with cough - albuterol (VENTOLIN HFA) 108 (90 Base) MCG/ACT inhaler; Inhale 2 puffs into the lungs every 6 (six) hours as needed for wheezing or shortness of breath.  Dispense: 8 g; Refill: 0 -  promethazine-dextromethorphan (PROMETHAZINE-DM) 6.25-15 MG/5ML syrup; Take 5 mLs by mouth 4 (four) times daily as needed for cough.  Dispense: 118 mL; Refill: 0 Will have her take home COVID test just to be cautious. She is to quarantine until getting negative result. She is to notify us if getting a positive result. Supportive measures, OTC medications reviewed. Rx Albuterol and promethazine-DM to use as directed. Strict in-person precautions reviewed.   2. Eustachian tube dysfunction, bilateral Start Flonase per orders. OTC medications reviewed.   Follow Up Instructions: I discussed the assessment and treatment plan with the patient. The patient was provided an opportunity to ask questions and all were answered. The patient agreed with the plan and demonstrated an understanding of the instructions.  A copy of instructions were sent to the patient via MyChart unless otherwise noted below.    The patient was advised to call back or seek an in-person evaluation if the symptoms worsen or if the condition fails to improve as anticipated.  Time:  I spent 14 minutes with the patient via telehealth technology discussing the above problems/concerns.    Piedad Climes, PA-C

## 2021-11-21 NOTE — Patient Instructions (Signed)
  Molly [name], thank you for joining Piedad Climes, PA-C for today's virtual visit.  While this provider is not your primary care provider (PCP), if your PCP is located in our provider database this encounter information will be shared with them immediately following your visit.  Consent: (Patient) Molly [name] provided verbal consent for this virtual visit at the beginning of the encounter.  Current Medications:  Current Outpatient Medications:    albuterol (VENTOLIN HFA) 108 (90 Base) MCG/ACT inhaler, Inhale into the lungs., Disp: , Rfl:    CRYSELLE-28 0.3-30 MG-MCG tablet, Take 1 tablet by mouth daily., Disp: 28 tablet, Rfl: 11   Medications ordered in this encounter:  No orders of the defined types were placed in this encounter.    *If you need refills on other medications prior to your next appointment, please contact your pharmacy*  Follow-Up: Call back or seek an in-person evaluation if the symptoms worsen or if the condition fails to improve as anticipated.  Other Instructions Please keep well-hydrated and get plenty of rest.  If you have a humidifier, place in the bedroom and run at night. You can start OTC Mucinex to help thin congestion.  Start saline nasal rinse and the Flonase I have sent in. Use the prescription cough medication as directed. The albuterol will help with chest tightness and spasm.  You can take OTC Sudafed for sinus/ear pressure.   Do not forget to take a home COVID test. You only have to let us know if it is positive.    If you have been instructed to have an in-person evaluation today at a local Urgent Care facility, please use the link below. It will take you to a list of all of our available [facility], including address, phone number and hours of operation. Please do not delay care.  [facility]  If you or a family member do not have a primary care provider, use the link below to schedule a visit and establish  care. When you choose a [facility] primary care physician or advanced practice provider, you gain a long-term partner in health. Find a Primary Care Provider  Learn more about [facility] in-office and virtual care options:  - Get Care Now

## 2022-04-03 IMAGING — US US PELVIS COMPLETE WITH TRANSVAGINAL
1 series · 14 of 25 positions shown · non-contrast
Comparison: None

CLINICAL DATA: Abnormal uterine bleeding

EXAM:
TRANSABDOMINAL AND TRANSVAGINAL ULTRASOUND OF PELVIS
TECHNIQUE: Both transabdominal and transvaginal ultrasound examinations of the
pelvis were performed. Transabdominal technique was performed for
global imaging of the pelvis including uterus, ovaries, adnexal
regions, and pelvic cul-de-sac. It was necessary to proceed with
endovaginal exam following the transabdominal exam to visualize the
right ovary and left adnexa.

[Series 1: us pelvic complete with transvaginal · 14 of 94 slices shown]
[im 1/94]
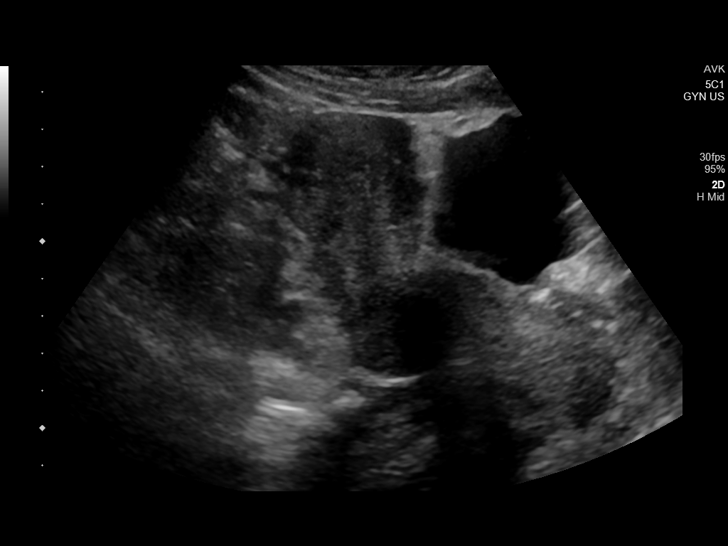
[im 8/94]
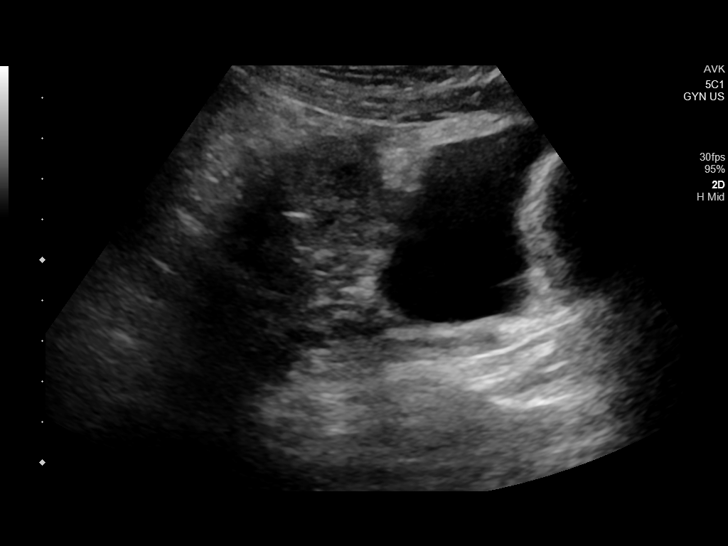
[im 16/94]
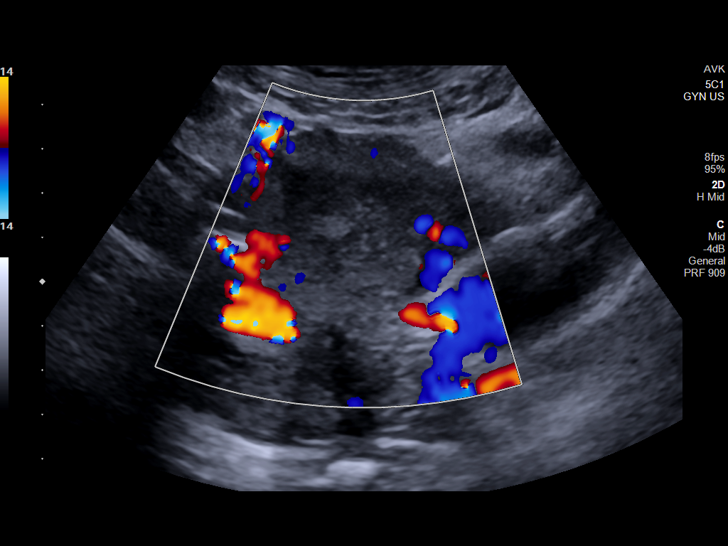
[im 24/94]
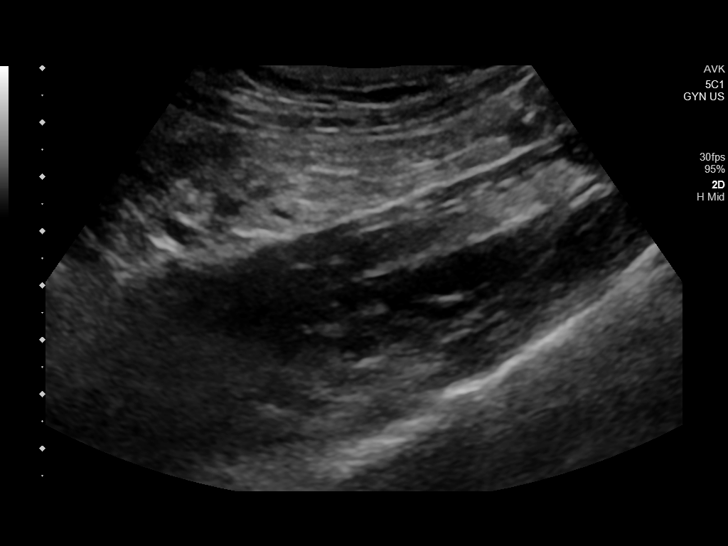
[im 32/94]
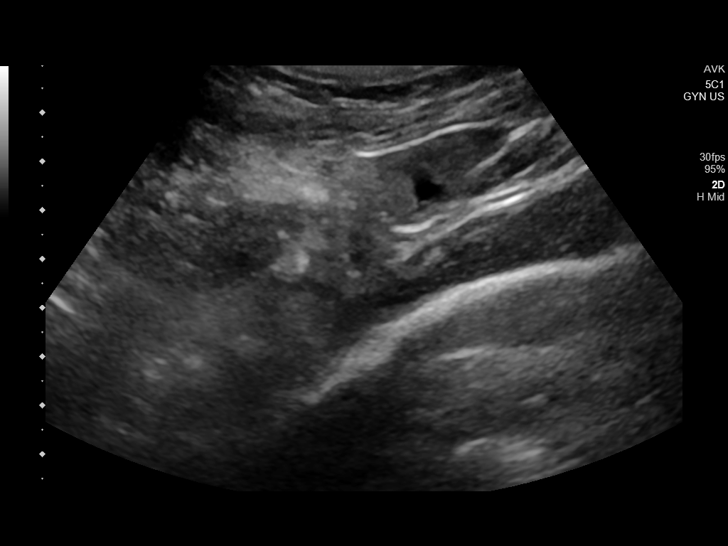
[im 35/94]
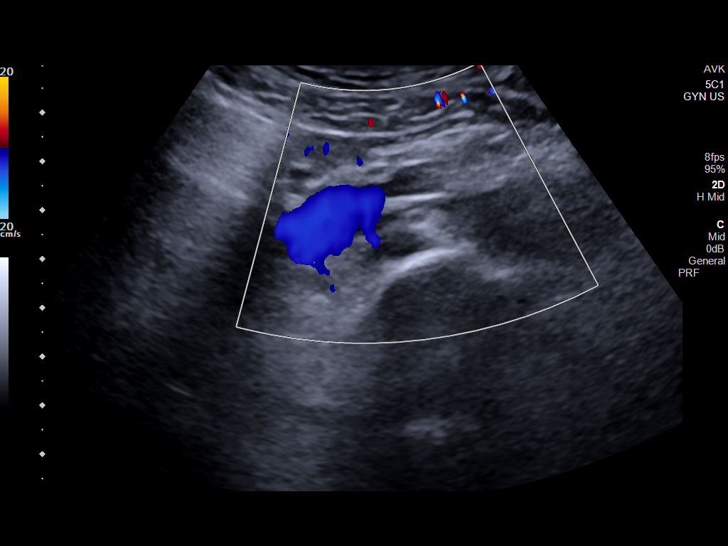
[im 43/94]
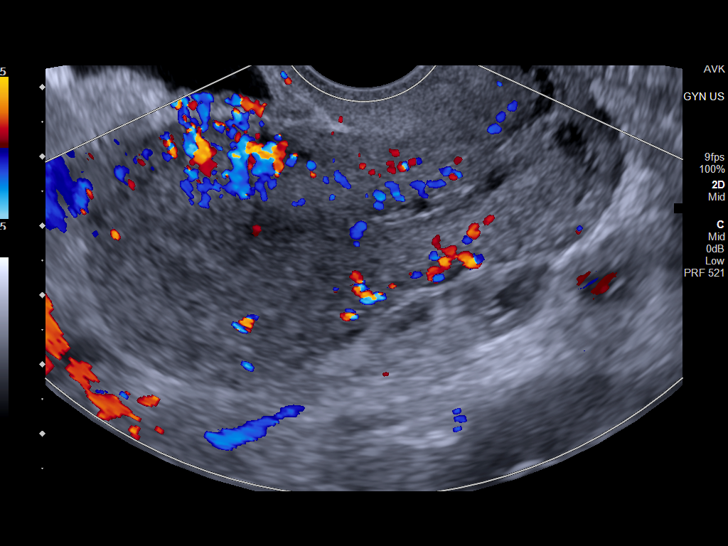
[im 51/94]
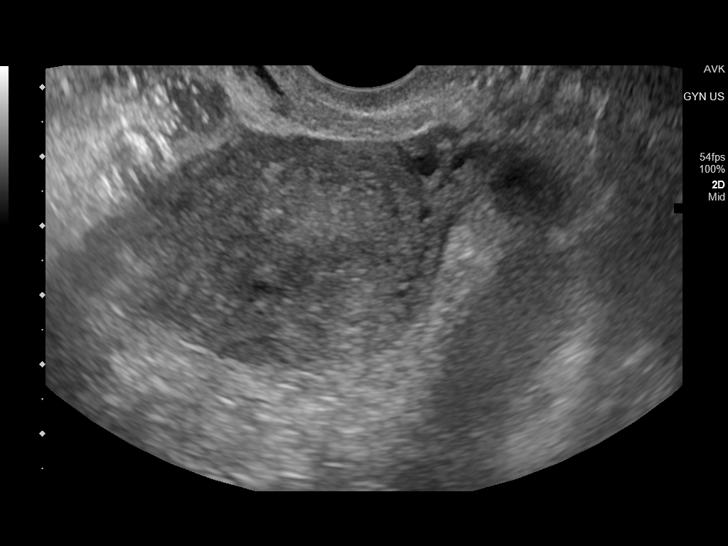
[im 59/94]
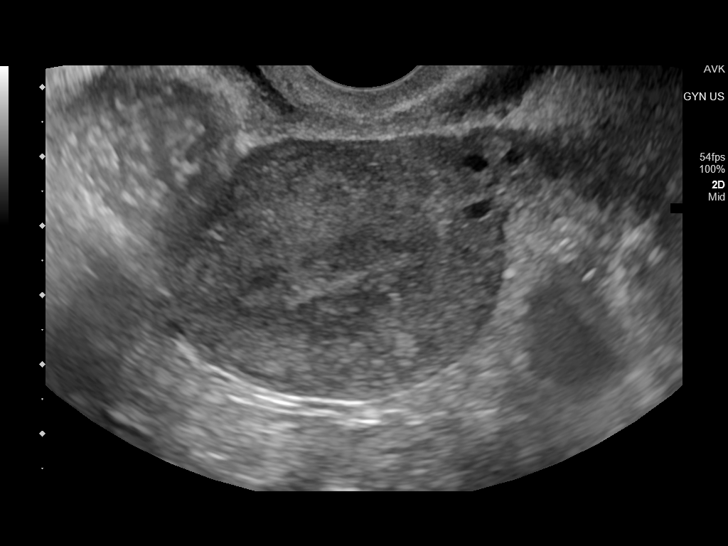
[im 63/94]
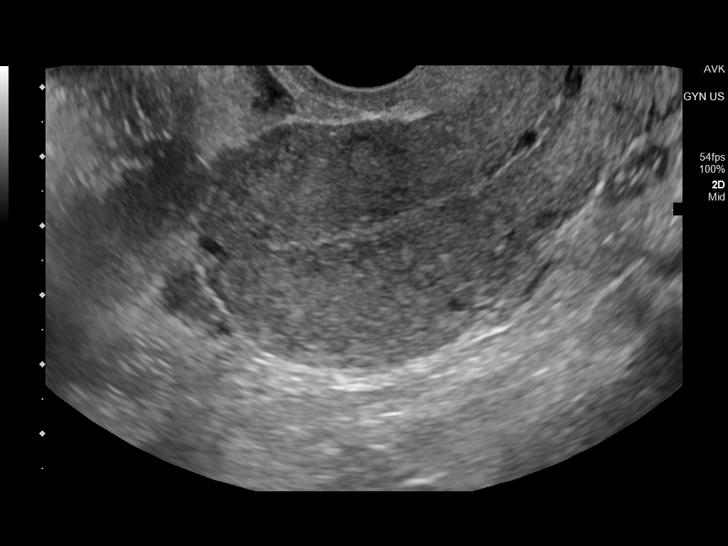
[im 70/94]
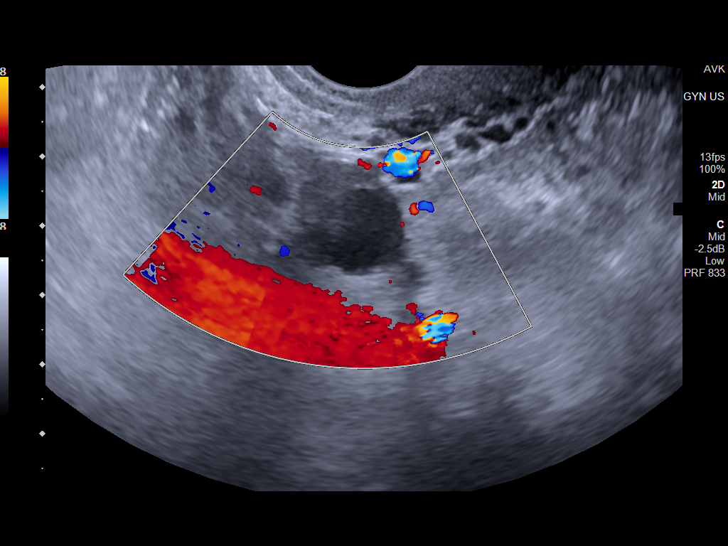
[im 78/94]
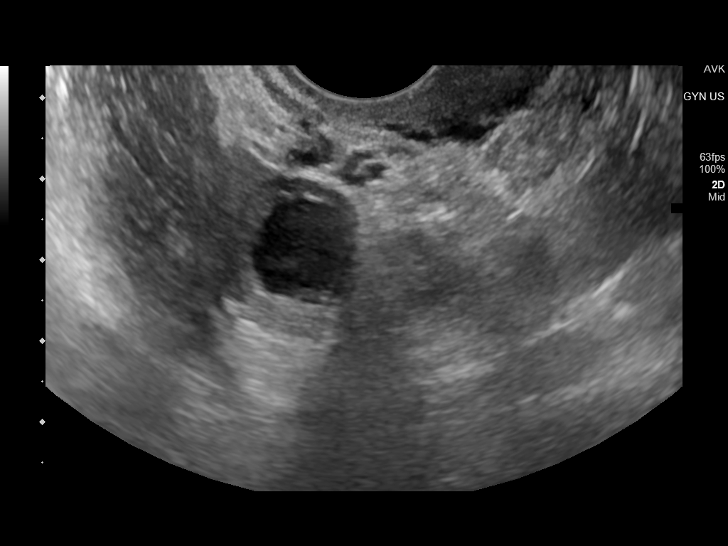
[im 86/94]
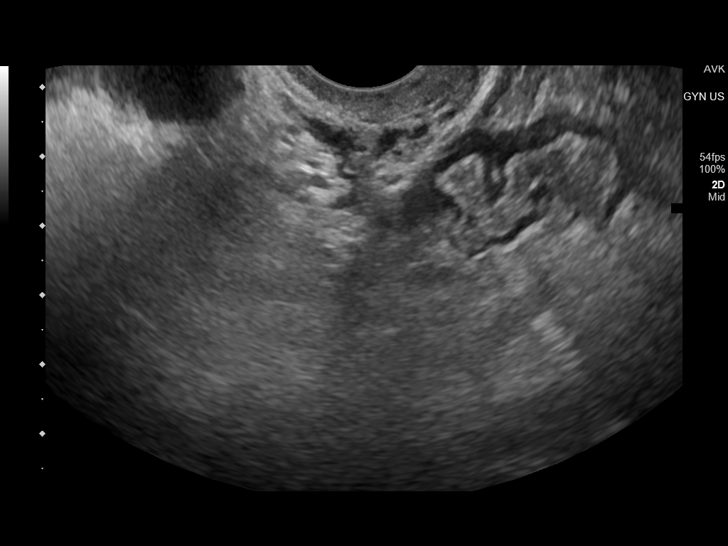
[im 94/94]
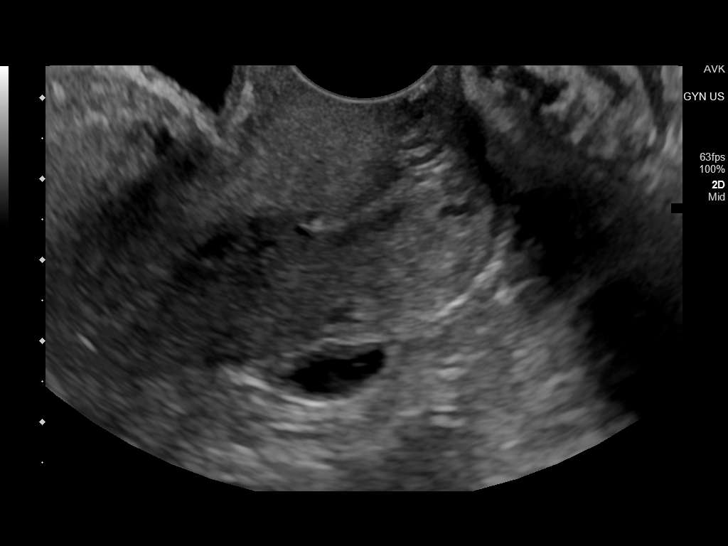

[14 of 25 positions shown; findings below may reference images not displayed]

FINDINGS: Uterus

Measurements: 7.8 x 3.9 x 4.2 cm = volume: 67 mL. No fibroids or
other mass visualized.

Endometrium

Thickness: 3 mm.  No focal abnormality visualized.

Right ovary

Measurements: 2.0 x 1.8 x 1.8 cm = volume: 3.2 mL. Normal
appearance/no adnexal mass.

Left ovary

Not discretely visualized.  No adnexal mass is seen.

Other findings

No abnormal free fluid.
IMPRESSION: Left ovary is not discretely visualized. No adnexal mass is seen.

Otherwise negative pelvic ultrasound.

## 2022-07-15 ENCOUNTER — Other Ambulatory Visit: Payer: Self-pay | Admitting: Obstetrics and Gynecology

## 2022-07-15 DIAGNOSIS — Z1231 Encounter for screening mammogram for malignant neoplasm of breast: Secondary | ICD-10-CM

## 2022-07-25 ENCOUNTER — Other Ambulatory Visit: Payer: Self-pay

## 2022-07-25 DIAGNOSIS — Z30011 Encounter for initial prescription of contraceptive pills: Secondary | ICD-10-CM

## 2022-07-25 MED ORDER — CRYSELLE-28 0.3-30 MG-MCG PO TABS: 1.0000 | ORAL_TABLET | Freq: Every day | ORAL | 0 refills | Status: DC

## 2022-07-25 NOTE — Telephone Encounter (Signed)
Patient contacted office stating that she has scheduled her annual physical with Helmut Muster Copland on 08/19/22 and is requesting a refill on her birth control Cryselle. Medication was last filled on 08/09/21, patients last office visit was11/16/22 and last PE was 08/09/21. Will refill prescription to last till appt on 08/19/22. KW

## 2022-08-17 NOTE — Progress Notes (Unsigned)
PCP: Schermerhorn, Ihor Austin, MD   No chief complaint on file.   HPI:      [name] is a 51 y.o. [id number] whose LMP was No LMP recorded. (Menstrual status: Irregular Periods)., presents today for her annual examination.  Her menses are regular every 28-30 days, lasting 4 days.  Dysmenorrhea {dysmen:716}. She {does:18564} have intermenstrual bleeding. She {does:18564} have vasomotor sx.   Sex activity: {sex active: 315163}. She {does:18564} have vaginal dryness.  Last Pap: 03/05/21 Results were: no abnormalities /neg HPV DNA.  Hx of STDs: {STD hx:14358}  Last mammogram: 09/30/21  Results were: normal--routine follow-up in 12 months There is no FH of breast cancer. There is no FH of ovarian cancer. The patient {does:18564} do self-breast exams.  Colonoscopy: 9/22 at [facility];  Repeat due after 10*** years.   Tobacco use: {tob:20664} Alcohol use: {Alcohol:11675} No drug use Exercise: {exercise:31265}  She {does:18564} get adequate calcium and Vitamin D in her diet.  Labs with PCP.   Patient Active Problem List   Diagnosis Date Noted   Screening for colon cancer    Migraines 08/13/2021   Poison ivy 06/14/2015    Past Surgical History:  Procedure Laterality Date   COLONOSCOPY WITH PROPOFOL N/A 09/13/2021   Procedure: COLONOSCOPY WITH PROPOFOL;  Surgeon: Pasty Spillers, MD;  Location: [facility] ENDOSCOPY;  Service: Endoscopy;  Laterality: N/A;   NO PAST SURGERIES      Family History  Problem Relation Age of Onset   Lung cancer Father    Breast cancer Paternal Aunt        35s/50s    Social History   Socioeconomic History   Marital status: Married    Spouse name: Not on file   Number of children: Not on file   Years of education: Not on file   Highest education level: Not on file  Occupational History   Not on file  Tobacco Use   Smoking status: Some Days    Types: Cigarettes   Smokeless tobacco: Never  Vaping Use   Vaping Use: Never used   Substance and Sexual Activity   Alcohol use: Yes    Comment: occ   Drug use: Never   Sexual activity: Yes    Birth control/protection: Pill  Other Topics Concern   Not on file  Social History Narrative   Not on file   Social Determinants of Health   Financial Resource Strain: Not on file  Food Insecurity: Not on file  Transportation Needs: Not on file  Physical Activity: Not on file  Stress: Not on file  Social Connections: Not on file  Intimate Partner Violence: Not on file     Current Outpatient Medications:    albuterol (VENTOLIN HFA) 108 (90 Base) MCG/ACT inhaler, Inhale 2 puffs into the lungs every 6 (six) hours as needed for wheezing or shortness of breath., Disp: 8 g, Rfl: 0   CRYSELLE-28 0.3-30 MG-MCG tablet, Take 1 tablet by mouth daily., Disp: 28 tablet, Rfl: 0   fluticasone (FLONASE) 50 MCG/ACT nasal spray, Place 2 sprays into both nostrils daily., Disp: 16 g, Rfl: 0   promethazine-dextromethorphan (PROMETHAZINE-DM) 6.25-15 MG/5ML syrup, Take 5 mLs by mouth 4 (four) times daily as needed for cough., Disp: 118 mL, Rfl: 0     ROS:  Review of Systems BREAST: No symptoms    Objective: There were no vitals taken for this visit.   OBGyn Exam  Results: No results found for this or any previous visit (  from the past 24 hour(s)).  Assessment/Plan:  No diagnosis found.   No orders of the defined types were placed in this encounter.           GYN counsel {counseling: 16159}    F/U  No follow-ups on file.  Shamarr Faucett B. Timmey Lamba, PA-C 08/17/2022 9:53 AM

## 2022-08-19 ENCOUNTER — Encounter: Payer: Self-pay | Admitting: Obstetrics and Gynecology

## 2022-08-19 ENCOUNTER — Ambulatory Visit (INDEPENDENT_AMBULATORY_CARE_PROVIDER_SITE_OTHER): Payer: BC Managed Care – PPO | Admitting: Obstetrics and Gynecology

## 2022-08-19 VITALS — BP 120/60 | Ht 65.0 in | Wt 150.0 lb

## 2022-08-19 DIAGNOSIS — Z3041 Encounter for surveillance of contraceptive pills: Secondary | ICD-10-CM | POA: Diagnosis not present

## 2022-08-19 DIAGNOSIS — Z87891 Personal history of nicotine dependence: Secondary | ICD-10-CM

## 2022-08-19 DIAGNOSIS — E875 Hyperkalemia: Secondary | ICD-10-CM

## 2022-08-19 DIAGNOSIS — Z1231 Encounter for screening mammogram for malignant neoplasm of breast: Secondary | ICD-10-CM

## 2022-08-19 DIAGNOSIS — Z0000 Encounter for general adult medical examination without abnormal findings: Secondary | ICD-10-CM | POA: Diagnosis not present

## 2022-08-19 DIAGNOSIS — Z01419 Encounter for gynecological examination (general) (routine) without abnormal findings: Secondary | ICD-10-CM

## 2022-08-19 DIAGNOSIS — Z13220 Encounter for screening for lipoid disorders: Secondary | ICD-10-CM

## 2022-08-19 DIAGNOSIS — Z1211 Encounter for screening for malignant neoplasm of colon: Secondary | ICD-10-CM

## 2022-08-19 MED ORDER — CRYSELLE-28 0.3-30 MG-MCG PO TABS: 1.0000 | ORAL_TABLET | Freq: Every day | ORAL | 3 refills | Status: DC

## 2022-08-19 NOTE — Patient Instructions (Signed)
I value your feedback and you entrusting us with your care. If you get a [facility] patient survey, I would appreciate you taking the time to let us know about your experience today. Thank you! ? ? ?

## 2022-08-20 LAB — COMPREHENSIVE METABOLIC PANEL
ALT: 20 IU/L (ref 0–32)
AST: 21 IU/L (ref 0–40)
Albumin/Globulin Ratio: 2.2 (ref 1.2–2.2)
Albumin: 4.7 g/dL (ref 3.8–4.9)
Alkaline Phosphatase: 65 IU/L (ref 44–121)
BUN/Creatinine Ratio: 17 (ref 9–23)
BUN: 12 mg/dL (ref 6–24)
Bilirubin Total: 0.3 mg/dL (ref 0.0–1.2)
CO2: 21 mmol/L (ref 20–29)
Calcium: 9.4 mg/dL (ref 8.7–10.2)
Chloride: 106 mmol/L (ref 96–106)
Creatinine, Ser: 0.7 mg/dL (ref 0.57–1.00)
Globulin, Total: 2.1 g/dL (ref 1.5–4.5)
Glucose: 104 mg/dL — ABNORMAL HIGH (ref 70–99)
Potassium: 5.4 mmol/L — ABNORMAL HIGH (ref 3.5–5.2)
Sodium: 141 mmol/L (ref 134–144)
Total Protein: 6.8 g/dL (ref 6.0–8.5)
eGFR: 105 mL/min/{1.73_m2} (ref >59)

## 2022-08-20 LAB — LIPID PANEL
Chol/HDL Ratio: 3.9 ratio (ref 0.0–4.4)
Cholesterol, Total: 185 mg/dL (ref 100–199)
HDL: 47 mg/dL (ref >39)
LDL Chol Calc (NIH): 121 mg/dL — ABNORMAL HIGH (ref 0–99)
Triglycerides: 90 mg/dL (ref 0–149)
VLDL Cholesterol Cal: 17 mg/dL (ref 5–40)

## 2022-08-22 NOTE — Addendum Note (Signed)
Addended by: Althea Grimmer B on: 08/22/2022 01:04 PM   Modules accepted: Orders

## 2022-08-22 NOTE — Progress Notes (Signed)
Pls check to make sure pt sees this before end of day. If not, please call her with message info. Thx.

## 2022-08-25 ENCOUNTER — Telehealth: Payer: Self-pay | Admitting: Obstetrics and Gynecology

## 2022-08-25 NOTE — Telephone Encounter (Signed)
Patient is scheduled for 08/28/22 for labs

## 2022-08-25 NOTE — Telephone Encounter (Signed)
Reached out to pt to schedule labs per ABC.  Left message for pt to call back to schedule.

## 2022-08-26 DIAGNOSIS — D225 Melanocytic nevi of trunk: Secondary | ICD-10-CM | POA: Diagnosis not present

## 2022-08-26 DIAGNOSIS — D2262 Melanocytic nevi of left upper limb, including shoulder: Secondary | ICD-10-CM | POA: Diagnosis not present

## 2022-08-26 DIAGNOSIS — D2261 Melanocytic nevi of right upper limb, including shoulder: Secondary | ICD-10-CM | POA: Diagnosis not present

## 2022-08-26 DIAGNOSIS — R208 Other disturbances of skin sensation: Secondary | ICD-10-CM | POA: Diagnosis not present

## 2022-08-26 DIAGNOSIS — D485 Neoplasm of uncertain behavior of skin: Secondary | ICD-10-CM | POA: Diagnosis not present

## 2022-08-26 DIAGNOSIS — D2272 Melanocytic nevi of left lower limb, including hip: Secondary | ICD-10-CM | POA: Diagnosis not present

## 2022-08-26 DIAGNOSIS — L820 Inflamed seborrheic keratosis: Secondary | ICD-10-CM | POA: Diagnosis not present

## 2022-08-28 ENCOUNTER — Other Ambulatory Visit: Payer: BC Managed Care – PPO

## 2022-08-28 DIAGNOSIS — E875 Hyperkalemia: Secondary | ICD-10-CM

## 2022-08-29 LAB — COMPREHENSIVE METABOLIC PANEL
ALT: 21 IU/L (ref 0–32)
AST: 15 IU/L (ref 0–40)
Albumin/Globulin Ratio: 2.2 (ref 1.2–2.2)
Albumin: 4.8 g/dL (ref 3.8–4.9)
Alkaline Phosphatase: 65 IU/L (ref 44–121)
BUN/Creatinine Ratio: 16 (ref 9–23)
BUN: 13 mg/dL (ref 6–24)
Bilirubin Total: 0.5 mg/dL (ref 0.0–1.2)
CO2: 22 mmol/L (ref 20–29)
Calcium: 9.5 mg/dL (ref 8.7–10.2)
Chloride: 106 mmol/L (ref 96–106)
Creatinine, Ser: 0.8 mg/dL (ref 0.57–1.00)
Globulin, Total: 2.2 g/dL (ref 1.5–4.5)
Glucose: 104 mg/dL — ABNORMAL HIGH (ref 70–99)
Potassium: 4.5 mmol/L (ref 3.5–5.2)
Sodium: 141 mmol/L (ref 134–144)
Total Protein: 7 g/dL (ref 6.0–8.5)
eGFR: 89 mL/min/{1.73_m2} (ref >59)

## 2022-09-12 DIAGNOSIS — D225 Melanocytic nevi of trunk: Secondary | ICD-10-CM | POA: Diagnosis not present

## 2022-10-01 ENCOUNTER — Ambulatory Visit
Admission: RE | Admit: 2022-10-01 | Discharge: 2022-10-01 | Disposition: A | Discharge status: Home / Self Care | Payer: BC Managed Care – PPO | Source: Ambulatory Visit | Attending: Obstetrics and Gynecology | Admitting: Obstetrics and Gynecology

## 2022-10-01 DIAGNOSIS — Z1231 Encounter for screening mammogram for malignant neoplasm of breast: Secondary | ICD-10-CM | POA: Diagnosis not present

## 2022-11-17 ENCOUNTER — Ambulatory Visit
Admission: RE | Admit: 2022-11-17 | Discharge: 2022-11-17 | Disposition: A | Discharge status: Home / Self Care | Payer: BC Managed Care – PPO | Source: Ambulatory Visit | Attending: Obstetrics and Gynecology | Admitting: Obstetrics and Gynecology

## 2022-11-17 DIAGNOSIS — Z87891 Personal history of nicotine dependence: Secondary | ICD-10-CM | POA: Insufficient documentation

## 2022-11-21 ENCOUNTER — Other Ambulatory Visit: Payer: Self-pay

## 2022-11-21 DIAGNOSIS — Z87891 Personal history of nicotine dependence: Secondary | ICD-10-CM

## 2022-11-21 DIAGNOSIS — Z122 Encounter for screening for malignant neoplasm of respiratory organs: Secondary | ICD-10-CM

## 2023-01-30 DIAGNOSIS — B9689 Other specified bacterial agents as the cause of diseases classified elsewhere: Secondary | ICD-10-CM | POA: Diagnosis not present

## 2023-01-30 DIAGNOSIS — Z03818 Encounter for observation for suspected exposure to other biological agents ruled out: Secondary | ICD-10-CM | POA: Diagnosis not present

## 2023-01-30 DIAGNOSIS — J209 Acute bronchitis, unspecified: Secondary | ICD-10-CM | POA: Diagnosis not present

## 2023-01-30 DIAGNOSIS — J019 Acute sinusitis, unspecified: Secondary | ICD-10-CM | POA: Diagnosis not present

## 2023-03-24 DIAGNOSIS — J039 Acute tonsillitis, unspecified: Secondary | ICD-10-CM | POA: Diagnosis not present

## 2023-03-24 DIAGNOSIS — H6692 Otitis media, unspecified, left ear: Secondary | ICD-10-CM | POA: Diagnosis not present

## 2023-03-24 DIAGNOSIS — R110 Nausea: Secondary | ICD-10-CM | POA: Diagnosis not present

## 2023-03-24 DIAGNOSIS — Z03818 Encounter for observation for suspected exposure to other biological agents ruled out: Secondary | ICD-10-CM | POA: Diagnosis not present

## 2023-03-27 DIAGNOSIS — R051 Acute cough: Secondary | ICD-10-CM | POA: Diagnosis not present

## 2023-03-27 DIAGNOSIS — R059 Cough, unspecified: Secondary | ICD-10-CM | POA: Diagnosis not present

## 2023-03-27 DIAGNOSIS — J40 Bronchitis, not specified as acute or chronic: Secondary | ICD-10-CM | POA: Diagnosis not present

## 2023-04-29 ENCOUNTER — Other Ambulatory Visit: Payer: Self-pay | Admitting: Obstetrics and Gynecology

## 2023-04-29 DIAGNOSIS — Z1231 Encounter for screening mammogram for malignant neoplasm of breast: Secondary | ICD-10-CM

## 2023-07-20 ENCOUNTER — Other Ambulatory Visit: Payer: Self-pay

## 2023-07-20 DIAGNOSIS — Z3041 Encounter for surveillance of contraceptive pills: Secondary | ICD-10-CM

## 2023-07-20 MED ORDER — CRYSELLE-28 0.3-30 MG-MCG PO TABS: 1.0000 | ORAL_TABLET | Freq: Every day | ORAL | 0 refills | Status: DC

## 2023-08-11 ENCOUNTER — Encounter: Payer: Self-pay | Admitting: Obstetrics and Gynecology

## 2023-08-12 DIAGNOSIS — L255 Unspecified contact dermatitis due to plants, except food: Secondary | ICD-10-CM | POA: Diagnosis not present

## 2023-08-25 ENCOUNTER — Ambulatory Visit: Payer: BC Managed Care – PPO | Admitting: Obstetrics and Gynecology

## 2023-08-25 NOTE — Progress Notes (Signed)
PCP: Rica Records, PA-C   Chief Complaint  Patient presents with   Gynecologic Exam    No concerns    HPI:      [name] is a 52 y.o. [id number] whose LMP was Patient's last menstrual period was 08/10/2023 (approximate)., presents today for her annual examination.  Her menses are regular every 28-30 days, lasting 3 days on OCPs, spotting only.  Dysmenorrhea none. She does not have intermenstrual bleeding. No vasomotor sx.   Sex activity: single partner, contraception - OCP (estrogen/progesterone). She does not have vaginal dryness/pain/bleeding.  Last Pap: 03/05/21 Results were: no abnormalities /neg HPV DNA.  No hx of abn paps with tx.  Last mammogram: 10/01/22  Results were: normal--routine follow-up in 12 months; has appt 10/24 There is a FH of breast cancer in her pat aunt, genetic testing not indicated. There is no FH of ovarian cancer. The patient does do self-breast exams.  Colonoscopy: 9/22 at [facility];  Repeat due after 10 years.   Tobacco use: quit several yrs ago; gets lung CT screening every Dec. Dad passed from lung cancer/smoker. Alcohol use: none No drug use Exercise: moderately active  She does get adequate calcium but not Vitamin D in her diet.  Fasting labs 2023   Patient Active Problem List   Diagnosis Date Noted   Former smoker 08/19/2022   Screening for colon cancer    Migraines 08/13/2021   Poison ivy 06/14/2015    Past Surgical History:  Procedure Laterality Date   COLONOSCOPY WITH PROPOFOL N/A 09/13/2021   Procedure: COLONOSCOPY WITH PROPOFOL;  Surgeon: Pasty Spillers, MD;  Location: [facility] ENDOSCOPY;  Service: Endoscopy;  Laterality: N/A;    Family History  Problem Relation Age of Onset   Lung cancer Father    Breast cancer Paternal Aunt        late 38s    Social History   Socioeconomic History   Marital status: Married    Spouse name: Not on file   Number of children: Not on file   Years of education: Not on  file   Highest education level: Not on file  Occupational History   Not on file  Tobacco Use   Smoking status: Some Days    Types: Cigarettes   Smokeless tobacco: Never  Vaping Use   Vaping status: Never Used  Substance and Sexual Activity   Alcohol use: Yes    Comment: occ   Drug use: Never   Sexual activity: Yes    Birth control/protection: Pill  Other Topics Concern   Not on file  Social History Narrative   Not on file   Social Determinants of Health   Financial Resource Strain: Not on file  Food Insecurity: Not on file  Transportation Needs: Not on file  Physical Activity: Not on file  Stress: Not on file  Social Connections: Not on file  Intimate Partner Violence: Not on file     Current Outpatient Medications:    CRYSELLE-28 0.3-30 MG-MCG tablet, Take 1 tablet by mouth daily., Disp: 84 tablet, Rfl: 0     ROS:  Review of Systems  Constitutional:  Negative for fatigue, fever and unexpected weight change.  Respiratory:  Negative for cough, shortness of breath and wheezing.   Cardiovascular:  Negative for chest pain, palpitations and leg swelling.  Gastrointestinal:  Negative for blood in stool, constipation, diarrhea, nausea and vomiting.  Endocrine: Negative for cold intolerance, heat intolerance and polyuria.  Genitourinary:  Negative for  dyspareunia, dysuria, flank pain, frequency, genital sores, hematuria, menstrual problem, pelvic pain, urgency, vaginal bleeding, vaginal discharge and vaginal pain.  Musculoskeletal:  Negative for back pain, joint swelling and myalgias.  Skin:  Negative for rash.  Neurological:  Negative for dizziness, syncope, light-headedness, numbness and headaches.  Hematological:  Negative for adenopathy.  Psychiatric/Behavioral:  Negative for agitation, confusion, sleep disturbance and suicidal ideas. The patient is not nervous/anxious.    BREAST: No symptoms    Objective: BP 102/60   Ht 5\' 5"  (1.651 m)   Wt 151 lb (68.5 kg)    LMP 08/10/2023 (Approximate)   BMI 25.13 kg/m    Physical Exam Constitutional:      Appearance: She is well-developed.  Genitourinary:     Vulva normal.     Right Labia: No rash, tenderness or lesions.    Left Labia: No tenderness, lesions or rash.    No vaginal discharge, erythema or tenderness.      Right Adnexa: not tender and no mass present.    Left Adnexa: not tender and no mass present.    No cervical friability or polyp.     Uterus is not enlarged or tender.  Breasts:    Right: No mass, nipple discharge, skin change or tenderness.     Left: No mass, nipple discharge, skin change or tenderness.  Neck:     Thyroid: No thyromegaly.  Cardiovascular:     Rate and Rhythm: Normal rate and regular rhythm.     Heart sounds: Normal heart sounds. No murmur heard. Pulmonary:     Effort: Pulmonary effort is normal.     Breath sounds: Normal breath sounds.  Abdominal:     Palpations: Abdomen is soft.     Tenderness: There is no abdominal tenderness. There is no guarding or rebound.  Musculoskeletal:        General: Normal range of motion.     Cervical back: Normal range of motion.  Lymphadenopathy:     Cervical: No cervical adenopathy.  Neurological:     General: No focal deficit present.     Mental Status: She is alert and oriented to person, place, and time.     Cranial Nerves: No cranial nerve deficit.  Skin:    General: Skin is warm and dry.  Psychiatric:        Mood and Affect: Mood normal.        Behavior: Behavior normal.        Thought Content: Thought content normal.        Judgment: Judgment normal.  Vitals reviewed.     Assessment/Plan:  Encounter for annual routine gynecological examination  Encounter for surveillance of contraceptive pills; will d/c OCPs for now since spotting only for 2 yrs with OCPs. If menses resume, will start lower dose OCP. Pt to f/u prn.   Encounter for screening mammogram for malignant neoplasm of breast; pt has mammo  appt  Former smoker - Plan: CT CHEST LUNG CANCER SCREENING LOW DOSE WO CONTRAST  Blood tests for routine general physical examination - Plan: Comprehensive metabolic panel, Lipid panel, Hemoglobin A1c  Screening cholesterol level - Plan: Lipid panel  Screening for diabetes mellitus - Plan: Hemoglobin A1c          GYN counsel breast self exam, mammography screening, menopause, adequate intake of calcium and vitamin D, diet and exercise    F/U  Return in about 1 year (around 08/30/2024).  Myah Guynes B. Lasasha Brophy, PA-C 08/31/2023 5:13 PM

## 2023-08-27 ENCOUNTER — Ambulatory Visit: Payer: BC Managed Care – PPO | Admitting: Obstetrics and Gynecology

## 2023-08-31 ENCOUNTER — Ambulatory Visit (INDEPENDENT_AMBULATORY_CARE_PROVIDER_SITE_OTHER): Payer: BC Managed Care – PPO | Admitting: Obstetrics and Gynecology

## 2023-08-31 ENCOUNTER — Encounter: Payer: Self-pay | Admitting: Obstetrics and Gynecology

## 2023-08-31 VITALS — BP 102/60 | Ht 65.0 in | Wt 151.0 lb

## 2023-08-31 DIAGNOSIS — Z01419 Encounter for gynecological examination (general) (routine) without abnormal findings: Secondary | ICD-10-CM

## 2023-08-31 DIAGNOSIS — Z87891 Personal history of nicotine dependence: Secondary | ICD-10-CM

## 2023-08-31 DIAGNOSIS — Z0000 Encounter for general adult medical examination without abnormal findings: Secondary | ICD-10-CM

## 2023-08-31 DIAGNOSIS — Z1231 Encounter for screening mammogram for malignant neoplasm of breast: Secondary | ICD-10-CM

## 2023-08-31 DIAGNOSIS — Z13220 Encounter for screening for lipoid disorders: Secondary | ICD-10-CM

## 2023-08-31 DIAGNOSIS — Z131 Encounter for screening for diabetes mellitus: Secondary | ICD-10-CM

## 2023-08-31 DIAGNOSIS — Z3041 Encounter for surveillance of contraceptive pills: Secondary | ICD-10-CM

## 2023-08-31 NOTE — Patient Instructions (Signed)
I value your feedback and you entrusting us with your care. If you get a [facility] patient survey, I would appreciate you taking the time to let us know about your experience today. Thank you! ? ? ?

## 2023-09-03 ENCOUNTER — Other Ambulatory Visit: Payer: BC Managed Care – PPO

## 2023-09-03 DIAGNOSIS — Z13220 Encounter for screening for lipoid disorders: Secondary | ICD-10-CM

## 2023-09-03 DIAGNOSIS — Z131 Encounter for screening for diabetes mellitus: Secondary | ICD-10-CM

## 2023-09-03 DIAGNOSIS — Z0000 Encounter for general adult medical examination without abnormal findings: Secondary | ICD-10-CM

## 2023-09-04 LAB — COMPREHENSIVE METABOLIC PANEL
ALT: 33 IU/L — ABNORMAL HIGH (ref 0–32)
AST: 23 IU/L (ref 0–40)
Albumin: 4.6 g/dL (ref 3.8–4.9)
Alkaline Phosphatase: 61 IU/L (ref 44–121)
BUN/Creatinine Ratio: 21 (ref 9–23)
BUN: 16 mg/dL (ref 6–24)
Bilirubin Total: 0.3 mg/dL (ref 0.0–1.2)
CO2: 22 mmol/L (ref 20–29)
Calcium: 9.4 mg/dL (ref 8.7–10.2)
Chloride: 104 mmol/L (ref 96–106)
Creatinine, Ser: 0.77 mg/dL (ref 0.57–1.00)
Globulin, Total: 2.2 g/dL (ref 1.5–4.5)
Glucose: 96 mg/dL (ref 70–99)
Potassium: 5 mmol/L (ref 3.5–5.2)
Sodium: 141 mmol/L (ref 134–144)
Total Protein: 6.8 g/dL (ref 6.0–8.5)
eGFR: 93 mL/min/{1.73_m2} (ref >59)

## 2023-09-04 LAB — HEMOGLOBIN A1C
Est. average glucose Bld gHb Est-mCnc: 114 mg/dL
Hgb A1c MFr Bld: 5.6 % (ref 4.8–5.6)

## 2023-09-04 LAB — LIPID PANEL
Chol/HDL Ratio: 4 ratio (ref 0.0–4.4)
Cholesterol, Total: 180 mg/dL (ref 100–199)
HDL: 45 mg/dL (ref >39)
LDL Chol Calc (NIH): 114 mg/dL — ABNORMAL HIGH (ref 0–99)
Triglycerides: 116 mg/dL (ref 0–149)
VLDL Cholesterol Cal: 21 mg/dL (ref 5–40)

## 2023-09-29 ENCOUNTER — Other Ambulatory Visit: Payer: Self-pay | Admitting: Obstetrics and Gynecology

## 2023-09-29 ENCOUNTER — Telehealth: Payer: Self-pay

## 2023-09-29 MED ORDER — LEVONORGESTREL-ETHINYL ESTRAD 0.1-20 MG-MCG PO TABS: 1.0000 | ORAL_TABLET | Freq: Every day | ORAL | 3 refills | Status: DC

## 2023-09-29 NOTE — Telephone Encounter (Signed)
I sent lower dose birth control pill to pharmacy for pt. Start now or Sunday. May have some BTB for a couple months but should regulate cycles again. F/u prn.

## 2023-09-29 NOTE — Telephone Encounter (Signed)
Pt aware.

## 2023-09-29 NOTE — Progress Notes (Signed)
Rx aviane due to BTB off OCPs, perimenopausal. Will decrease dose given age.

## 2023-09-29 NOTE — Telephone Encounter (Signed)
Patient following up regarding menses and meds. She states she was seen 08/31/23 for annual and discussed adjusting of medications based on her cycles. She had a period in September it was pretty heavy and has been spotting since then. Having to wear at least a panty liner daily.

## 2023-10-05 ENCOUNTER — Ambulatory Visit: Payer: BC Managed Care – PPO

## 2023-10-06 ENCOUNTER — Ambulatory Visit: Payer: BC Managed Care – PPO

## 2023-10-06 ENCOUNTER — Ambulatory Visit
Admission: RE | Admit: 2023-10-06 | Discharge: 2023-10-06 | Disposition: A | Discharge status: Home / Self Care | Payer: BC Managed Care – PPO | Source: Ambulatory Visit | Attending: Obstetrics and Gynecology | Admitting: Obstetrics and Gynecology

## 2023-10-06 ENCOUNTER — Other Ambulatory Visit: Payer: Self-pay | Admitting: Obstetrics and Gynecology

## 2023-10-06 DIAGNOSIS — Z1231 Encounter for screening mammogram for malignant neoplasm of breast: Secondary | ICD-10-CM | POA: Diagnosis not present

## 2023-11-19 ENCOUNTER — Ambulatory Visit
Admission: RE | Admit: 2023-11-19 | Discharge: 2023-11-19 | Disposition: A | Discharge status: Home / Self Care | Payer: BC Managed Care – PPO | Source: Ambulatory Visit | Attending: Obstetrics and Gynecology | Admitting: Obstetrics and Gynecology

## 2023-11-19 DIAGNOSIS — Z87891 Personal history of nicotine dependence: Secondary | ICD-10-CM | POA: Insufficient documentation

## 2023-11-19 DIAGNOSIS — Z122 Encounter for screening for malignant neoplasm of respiratory organs: Secondary | ICD-10-CM | POA: Diagnosis not present

## 2023-11-27 ENCOUNTER — Encounter: Payer: Self-pay | Admitting: Obstetrics and Gynecology

## 2023-12-04 ENCOUNTER — Other Ambulatory Visit: Payer: Self-pay

## 2023-12-04 DIAGNOSIS — Z122 Encounter for screening for malignant neoplasm of respiratory organs: Secondary | ICD-10-CM

## 2023-12-04 DIAGNOSIS — Z87891 Personal history of nicotine dependence: Secondary | ICD-10-CM

## 2024-03-30 ENCOUNTER — Encounter: Payer: Self-pay | Admitting: Obstetrics and Gynecology

## 2024-08-24 ENCOUNTER — Other Ambulatory Visit: Payer: Self-pay | Admitting: Obstetrics and Gynecology

## 2024-09-02 NOTE — Progress Notes (Signed)
 PCP: Watt Bernarda NOVAK, PA-C   Chief Complaint  Patient presents with   Gynecologic Exam    No concerns    HPI:      [name] is a 53 y.o. [phone] whose LMP was No LMP recorded (lmp unknown). (Menstrual status: Irregular Periods)., presents today for her annual examination.  Her menses are regular every 28-30 days, lasting 2 days on OCPs, light flow with clots. Tried to stop OCPs last yr but had heavy periods again. No BTB, no dysmen. Has some night sweats.   Sex activity: single partner, contraception - OCP (estrogen/progesterone). She does not have vaginal dryness/pain/bleeding.  Last Pap: 03/05/21 Results were: no abnormalities /neg HPV DNA.  No hx of abn paps with tx.  Last mammogram: 10/06/23  Results were: normal--routine follow-up in 12 months There is a FH of breast cancer in her pat aunt, genetic testing not indicated. There is no FH of ovarian cancer. The patient does do self-breast exams.  Colonoscopy: 9/22 at [facility];  Repeat due after 10 years.   Tobacco use: quit several yrs ago; gets lung CT screening every Dec. Dad passed from lung cancer/smoker. Alcohol use: none No drug use Exercise: min active  She does get adequate calcium and Vitamin D in her diet.  Fasting labs 2023 and 2024; slightly elevated LDL   Patient Active Problem List   Diagnosis Date Noted   Former smoker 08/19/2022   Screening for colon cancer    Migraines 08/13/2021   Poison ivy 06/14/2015    Past Surgical History:  Procedure Laterality Date   COLONOSCOPY WITH PROPOFOL  N/A 09/13/2021   Procedure: COLONOSCOPY WITH PROPOFOL ;  Surgeon: Janalyn Keene NOVAK, MD;  Location: [facility] ENDOSCOPY;  Service: Endoscopy;  Laterality: N/A;    Family History  Problem Relation Age of Onset   Lung cancer Father    Breast cancer Paternal Aunt        late 38s    Social History   Socioeconomic History   Marital status: Married    Spouse name: Not on file   Number of children: Not  on file   Years of education: Not on file   Highest education level: Not on file  Occupational History   Not on file  Tobacco Use   Smoking status: Some Days    Types: Cigarettes   Smokeless tobacco: Never  Vaping Use   Vaping status: Never Used  Substance and Sexual Activity   Alcohol use: Yes    Comment: occ   Drug use: Never   Sexual activity: Yes    Birth control/protection: Pill  Other Topics Concern   Not on file  Social History Narrative   Not on file   Social Drivers of Health   Financial Resource Strain: Not on file  Food Insecurity: Not on file  Transportation Needs: Not on file  Physical Activity: Not on file  Stress: Not on file  Social Connections: Not on file  Intimate Partner Violence: Not on file     Current Outpatient Medications:    levonorgestrel -ethinyl estradiol (AVIANE) 0.1-20 MG-MCG tablet, Take 1 tablet by mouth daily., Disp: 84 tablet, Rfl: 3     ROS:  Review of Systems  Constitutional:  Negative for fatigue, fever and unexpected weight change.  Respiratory:  Negative for cough, shortness of breath and wheezing.   Cardiovascular:  Negative for chest pain, palpitations and leg swelling.  Gastrointestinal:  Negative for blood in stool, constipation, diarrhea, nausea and vomiting.  Endocrine: Negative for cold intolerance, heat intolerance and polyuria.  Genitourinary:  Negative for dyspareunia, dysuria, flank pain, frequency, genital sores, hematuria, menstrual problem, pelvic pain, urgency, vaginal bleeding, vaginal discharge and vaginal pain.  Musculoskeletal:  Negative for back pain, joint swelling and myalgias.  Skin:  Negative for rash.  Neurological:  Negative for dizziness, syncope, light-headedness, numbness and headaches.  Hematological:  Negative for adenopathy.  Psychiatric/Behavioral:  Negative for agitation, confusion, sleep disturbance and suicidal ideas. The patient is not nervous/anxious.    BREAST: No  symptoms    Objective: BP 117/72   Pulse 66   Ht 5' 5 (1.651 m)   Wt 159 lb (72.1 kg)   LMP  (LMP Unknown)   BMI 26.46 kg/m    Physical Exam Constitutional:      Appearance: She is well-developed.  Genitourinary:     Vulva normal.     Genitourinary Comments: GRADE 1 APICAL PROLAPSE     Right Labia: No rash, tenderness or lesions.    Left Labia: No tenderness, lesions or rash.    No vaginal discharge, erythema or tenderness.     Apical vaginal prolapse present.     Right Adnexa: not tender and no mass present.    Left Adnexa: not tender and no mass present.    No cervical friability or polyp.     Uterus is not enlarged or tender.  Breasts:    Right: No mass, nipple discharge, skin change or tenderness.     Left: No mass, nipple discharge, skin change or tenderness.  Neck:     Thyroid : No thyromegaly.  Cardiovascular:     Rate and Rhythm: Normal rate and regular rhythm.     Heart sounds: Normal heart sounds. No murmur heard. Pulmonary:     Effort: Pulmonary effort is normal.     Breath sounds: Normal breath sounds.  Abdominal:     Palpations: Abdomen is soft.     Tenderness: There is no abdominal tenderness. There is no guarding or rebound.  Musculoskeletal:        General: Normal range of motion.     Cervical back: Normal range of motion.  Lymphadenopathy:     Cervical: No cervical adenopathy.  Neurological:     General: No focal deficit present.     Mental Status: She is alert and oriented to person, place, and time.     Cranial Nerves: No cranial nerve deficit.  Skin:    General: Skin is warm and dry.  Psychiatric:        Mood and Affect: Mood normal.        Behavior: Behavior normal.        Thought Content: Thought content normal.        Judgment: Judgment normal.  Vitals reviewed.     Assessment/Plan:  Encounter for annual routine gynecological examination  Cervical cancer screening - Plan: Cytology - PAP  Screening for HPV (human  papillomavirus) - Plan: Cytology - PAP  Encounter for surveillance of contraceptive pills - Plan: levonorgestrel -ethinyl estradiol (AVIANE) 0.1-20 MG-MCG tablet; Rx RF eRxd. Will re-eval next yr due to age; still had menses 9/24 when stopped OCPs last yr.   Encounter for screening mammogram for malignant neoplasm of breast - Plan: MM 3D SCREENING MAMMOGRAM BILATERAL BREAST; pt to schedule mammo  Former smoker - Plan: CT CHEST LUNG CANCER SCREENING LOW DOSE WO CONTRAST; pt to schedule CT  Blood tests for routine general physical examination - Plan: CBC with Differential/Platelet, Comprehensive metabolic  panel with GFR, Hemoglobin A1c, Lipid panel  Screening cholesterol level - Plan: Lipid panel  Screening for diabetes mellitus - Plan: Hemoglobin A1c  Pt made aware to find PCP since I'm retiring.          GYN counsel breast self exam, mammography screening, menopause, adequate intake of calcium and vitamin D, diet and exercise    F/U  Return in about 1 year (around 09/05/2025).  Molly Klimas B. Kinzee Happel, PA-C 09/05/2024 4:20 PM

## 2024-09-05 ENCOUNTER — Encounter: Payer: Self-pay | Admitting: Obstetrics and Gynecology

## 2024-09-05 ENCOUNTER — Ambulatory Visit (INDEPENDENT_AMBULATORY_CARE_PROVIDER_SITE_OTHER): Admitting: Obstetrics and Gynecology

## 2024-09-05 ENCOUNTER — Other Ambulatory Visit (HOSPITAL_COMMUNITY)
Admission: RE | Admit: 2024-09-05 | Discharge: 2024-09-05 | Disposition: A | Discharge status: Home / Self Care | Source: Ambulatory Visit | Attending: Obstetrics and Gynecology | Admitting: Obstetrics and Gynecology

## 2024-09-05 VITALS — BP 117/72 | HR 66 | Ht 65.0 in | Wt 159.0 lb

## 2024-09-05 DIAGNOSIS — Z01419 Encounter for gynecological examination (general) (routine) without abnormal findings: Secondary | ICD-10-CM | POA: Diagnosis not present

## 2024-09-05 DIAGNOSIS — Z87891 Personal history of nicotine dependence: Secondary | ICD-10-CM

## 2024-09-05 DIAGNOSIS — R7989 Other specified abnormal findings of blood chemistry: Secondary | ICD-10-CM

## 2024-09-05 DIAGNOSIS — Z124 Encounter for screening for malignant neoplasm of cervix: Secondary | ICD-10-CM | POA: Diagnosis present

## 2024-09-05 DIAGNOSIS — Z1231 Encounter for screening mammogram for malignant neoplasm of breast: Secondary | ICD-10-CM

## 2024-09-05 DIAGNOSIS — Z0000 Encounter for general adult medical examination without abnormal findings: Secondary | ICD-10-CM

## 2024-09-05 DIAGNOSIS — Z131 Encounter for screening for diabetes mellitus: Secondary | ICD-10-CM

## 2024-09-05 DIAGNOSIS — Z13220 Encounter for screening for lipoid disorders: Secondary | ICD-10-CM

## 2024-09-05 DIAGNOSIS — Z1151 Encounter for screening for human papillomavirus (HPV): Secondary | ICD-10-CM

## 2024-09-05 DIAGNOSIS — Z3041 Encounter for surveillance of contraceptive pills: Secondary | ICD-10-CM

## 2024-09-05 MED ORDER — LEVONORGESTREL-ETHINYL ESTRAD 0.1-20 MG-MCG PO TABS: 1.0000 | ORAL_TABLET | Freq: Every day | ORAL | 3 refills | Status: AC

## 2024-09-05 NOTE — Patient Instructions (Addendum)
 I value your feedback and you entrusting [place] with your care. If you get a [facility] patient survey, I would appreciate you taking the time to let us know about your experience today. Thank you!  [facility] ([place])--[phone]

## 2024-09-08 ENCOUNTER — Ambulatory Visit

## 2024-09-08 LAB — CYTOLOGY - PAP
Comment: NEGATIVE
Diagnosis: NEGATIVE
High risk HPV: NEGATIVE

## 2024-09-09 ENCOUNTER — Other Ambulatory Visit

## 2024-09-09 DIAGNOSIS — Z13220 Encounter for screening for lipoid disorders: Secondary | ICD-10-CM

## 2024-09-09 DIAGNOSIS — Z131 Encounter for screening for diabetes mellitus: Secondary | ICD-10-CM

## 2024-09-09 DIAGNOSIS — Z0000 Encounter for general adult medical examination without abnormal findings: Secondary | ICD-10-CM

## 2024-09-10 LAB — HEMOGLOBIN A1C
Est. average glucose Bld gHb Est-mCnc: 111 mg/dL
Hgb A1c MFr Bld: 5.5 % (ref 4.8–5.6)

## 2024-09-10 LAB — LIPID PANEL
Chol/HDL Ratio: 4.7 ratio — ABNORMAL HIGH (ref 0.0–4.4)
Cholesterol, Total: 208 mg/dL — ABNORMAL HIGH (ref 100–199)
HDL: 44 mg/dL (ref >39)
LDL Chol Calc (NIH): 146 mg/dL — ABNORMAL HIGH (ref 0–99)
Triglycerides: 97 mg/dL (ref 0–149)
VLDL Cholesterol Cal: 18 mg/dL (ref 5–40)

## 2024-09-10 LAB — COMPREHENSIVE METABOLIC PANEL WITH GFR
ALT: 83 IU/L — ABNORMAL HIGH (ref 0–32)
AST: 64 IU/L — ABNORMAL HIGH (ref 0–40)
Albumin: 4.6 g/dL (ref 3.8–4.9)
Alkaline Phosphatase: 71 IU/L (ref 49–135)
BUN/Creatinine Ratio: 13 (ref 9–23)
BUN: 11 mg/dL (ref 6–24)
Bilirubin Total: 0.4 mg/dL (ref 0.0–1.2)
CO2: 19 mmol/L — ABNORMAL LOW (ref 20–29)
Calcium: 9.3 mg/dL (ref 8.7–10.2)
Chloride: 103 mmol/L (ref 96–106)
Creatinine, Ser: 0.83 mg/dL (ref 0.57–1.00)
Globulin, Total: 2.1 g/dL (ref 1.5–4.5)
Glucose: 94 mg/dL (ref 70–99)
Potassium: 4.6 mmol/L (ref 3.5–5.2)
Sodium: 140 mmol/L (ref 134–144)
Total Protein: 6.7 g/dL (ref 6.0–8.5)
eGFR: 84 mL/min/1.73 (ref >59)

## 2024-09-10 LAB — CBC WITH DIFFERENTIAL/PLATELET
Basophils Absolute: 0.1 x10E3/uL (ref 0.0–0.2)
Basos: 1 %
EOS (ABSOLUTE): 0 x10E3/uL (ref 0.0–0.4)
Eos: 0 %
Hematocrit: 43.6 % (ref 34.0–46.6)
Hemoglobin: 14 g/dL (ref 11.1–15.9)
Immature Grans (Abs): 0 x10E3/uL (ref 0.0–0.1)
Immature Granulocytes: 0 %
Lymphocytes Absolute: 2.2 x10E3/uL (ref 0.7–3.1)
Lymphs: 33 %
MCH: 30.8 pg (ref 26.6–33.0)
MCHC: 32.1 g/dL (ref 31.5–35.7)
MCV: 96 fL (ref 79–97)
Monocytes Absolute: 0.6 x10E3/uL (ref 0.1–0.9)
Monocytes: 9 %
Neutrophils Absolute: 3.7 x10E3/uL (ref 1.4–7.0)
Neutrophils: 57 %
Platelets: 194 x10E3/uL (ref 150–450)
RBC: 4.55 x10E6/uL (ref 3.77–5.28)
RDW: 12.5 % (ref 11.7–15.4)
WBC: 6.5 x10E3/uL (ref 3.4–10.8)

## 2024-09-12 ENCOUNTER — Ambulatory Visit: Payer: Self-pay | Admitting: Obstetrics and Gynecology

## 2024-09-13 NOTE — Addendum Note (Signed)
 Addended by: WATT HILA B on: 09/13/2024 08:08 AM   Modules accepted: Orders

## 2024-09-16 ENCOUNTER — Other Ambulatory Visit: Payer: Self-pay | Admitting: Unknown Physician Specialty

## 2024-09-16 DIAGNOSIS — R221 Localized swelling, mass and lump, neck: Secondary | ICD-10-CM

## 2024-09-20 ENCOUNTER — Ambulatory Visit
Admission: RE | Admit: 2024-09-20 | Discharge: 2024-09-20 | Disposition: A | Source: Ambulatory Visit | Attending: Unknown Physician Specialty | Admitting: Unknown Physician Specialty

## 2024-09-20 DIAGNOSIS — R221 Localized swelling, mass and lump, neck: Secondary | ICD-10-CM

## 2024-09-20 MED ORDER — IOPAMIDOL (ISOVUE-300) INJECTION 61%
75.0000 mL | Freq: Once | INTRAVENOUS | Status: AC | PRN
  Administered 2024-09-20: 75 mL via INTRAVENOUS

## 2024-10-06 ENCOUNTER — Ambulatory Visit
Admission: RE | Admit: 2024-10-06 | Discharge: 2024-10-06 | Disposition: A | Discharge status: Home / Self Care | Source: Ambulatory Visit | Attending: Obstetrics and Gynecology | Admitting: Obstetrics and Gynecology

## 2024-10-06 DIAGNOSIS — Z1231 Encounter for screening mammogram for malignant neoplasm of breast: Secondary | ICD-10-CM | POA: Diagnosis present

## 2024-11-18 ENCOUNTER — Ambulatory Visit: Admission: RE | Admit: 2024-11-18 | Source: Ambulatory Visit

## 2024-11-23 ENCOUNTER — Ambulatory Visit
Admission: RE | Admit: 2024-11-23 | Discharge: 2024-11-23 | Disposition: A | Source: Ambulatory Visit | Attending: Obstetrics and Gynecology

## 2024-11-23 DIAGNOSIS — Z87891 Personal history of nicotine dependence: Secondary | ICD-10-CM | POA: Insufficient documentation
